# Patient Record
Sex: Female | Born: 1935 | Marital: Married | Smoking: Never smoker
Health system: Southern US, Community
[De-identification: ages and names within clinical notes are randomized; demographics above are authoritative.]

## PROBLEM LIST (undated history)

## (undated) DIAGNOSIS — I10 Essential (primary) hypertension: Secondary | ICD-10-CM

## (undated) HISTORY — PX: ORIF DISTAL RADIUS FRACTURE: SUR927

---

## 2000-01-19 ENCOUNTER — Encounter: Admission: RE | Admit: 2000-01-19 | Discharge: 2000-01-19 | Payer: Self-pay | Admitting: Geriatric Medicine

## 2000-01-19 ENCOUNTER — Encounter: Payer: Self-pay | Admitting: Geriatric Medicine

## 2001-01-20 ENCOUNTER — Encounter: Payer: Self-pay | Admitting: Geriatric Medicine

## 2001-01-20 ENCOUNTER — Encounter: Admission: RE | Admit: 2001-01-20 | Discharge: 2001-01-20 | Payer: Self-pay | Admitting: Geriatric Medicine

## 2002-01-15 ENCOUNTER — Ambulatory Visit (HOSPITAL_COMMUNITY): Admission: RE | Admit: 2002-01-15 | Discharge: 2002-01-15 | Payer: Self-pay | Admitting: Geriatric Medicine

## 2002-01-21 ENCOUNTER — Encounter: Admission: RE | Admit: 2002-01-21 | Discharge: 2002-01-21 | Payer: Self-pay | Admitting: Geriatric Medicine

## 2002-01-21 ENCOUNTER — Encounter: Payer: Self-pay | Admitting: Geriatric Medicine

## 2003-01-21 ENCOUNTER — Encounter: Admission: RE | Admit: 2003-01-21 | Discharge: 2003-01-21 | Payer: Self-pay

## 2003-01-21 ENCOUNTER — Encounter: Payer: Self-pay | Admitting: Geriatric Medicine

## 2004-02-11 ENCOUNTER — Encounter: Admission: RE | Admit: 2004-02-11 | Discharge: 2004-02-11 | Payer: Self-pay | Admitting: Geriatric Medicine

## 2004-03-06 ENCOUNTER — Ambulatory Visit (HOSPITAL_COMMUNITY): Admission: RE | Admit: 2004-03-06 | Discharge: 2004-03-06 | Payer: Self-pay | Admitting: Gastroenterology

## 2005-02-28 ENCOUNTER — Encounter: Admission: RE | Admit: 2005-02-28 | Discharge: 2005-02-28 | Payer: Self-pay | Admitting: Geriatric Medicine

## 2005-06-17 ENCOUNTER — Emergency Department (HOSPITAL_COMMUNITY): Admission: EM | Admit: 2005-06-17 | Discharge: 2005-06-17 | Payer: Self-pay | Admitting: Emergency Medicine

## 2005-06-20 ENCOUNTER — Emergency Department (HOSPITAL_COMMUNITY): Admission: EM | Admit: 2005-06-20 | Discharge: 2005-06-20 | Payer: Self-pay | Admitting: Family Medicine

## 2005-07-13 ENCOUNTER — Emergency Department (HOSPITAL_COMMUNITY): Admission: EM | Admit: 2005-07-13 | Discharge: 2005-07-13 | Payer: Self-pay | Admitting: Family Medicine

## 2005-07-15 ENCOUNTER — Emergency Department (HOSPITAL_COMMUNITY): Admission: EM | Admit: 2005-07-15 | Discharge: 2005-07-15 | Payer: Self-pay | Admitting: Family Medicine

## 2006-03-05 ENCOUNTER — Encounter: Admission: RE | Admit: 2006-03-05 | Discharge: 2006-03-05 | Payer: Self-pay | Admitting: Geriatric Medicine

## 2007-03-07 ENCOUNTER — Encounter: Admission: RE | Admit: 2007-03-07 | Discharge: 2007-03-07 | Payer: Self-pay | Admitting: Geriatric Medicine

## 2008-03-18 ENCOUNTER — Encounter: Admission: RE | Admit: 2008-03-18 | Discharge: 2008-03-18 | Payer: Self-pay | Admitting: Geriatric Medicine

## 2009-03-29 ENCOUNTER — Encounter: Admission: RE | Admit: 2009-03-29 | Discharge: 2009-03-29 | Payer: Self-pay | Admitting: Geriatric Medicine

## 2010-04-11 ENCOUNTER — Encounter: Admission: RE | Admit: 2010-04-11 | Discharge: 2010-04-11 | Payer: Self-pay | Admitting: Geriatric Medicine

## 2010-10-10 ENCOUNTER — Emergency Department (HOSPITAL_COMMUNITY): Admission: EM | Admit: 2010-10-10 | Discharge: 2010-10-10 | Payer: Self-pay | Admitting: Family Medicine

## 2010-10-13 ENCOUNTER — Ambulatory Visit (HOSPITAL_COMMUNITY): Admission: RE | Admit: 2010-10-13 | Discharge: 2010-10-13 | Payer: Self-pay | Admitting: Orthopedic Surgery

## 2011-01-09 NOTE — Procedures (Signed)
°

## 2011-01-09 NOTE — Miscellaneous (Signed)
°

## 2011-01-09 NOTE — Op Note (Signed)
°

## 2011-01-09 NOTE — Laboratory Report (Signed)
°

## 2011-01-09 NOTE — ED Provider Notes (Signed)
°

## 2011-01-09 NOTE — Patient Instructions (Signed)
°

## 2011-01-09 NOTE — Medication Information (Signed)
°

## 2011-01-09 NOTE — EKG Historical (Signed)
°

## 2011-01-09 NOTE — ED Notes (Signed)
°

## 2011-01-09 NOTE — Consult Note (Signed)
°

## 2011-01-09 NOTE — Consent Form (Signed)
°

## 2011-01-09 NOTE — Progress Notes (Signed)
°

## 2011-02-28 LAB — CBC
MCV: 84.8 fL (ref 78.0–100.0)
Platelets: 265 10*3/uL (ref 150–400)
RBC: 4.81 MIL/uL (ref 3.87–5.11)
WBC: 10.2 10*3/uL (ref 4.0–10.5)

## 2011-03-05 NOTE — Miscellaneous (Signed)
°

## 2011-03-15 ENCOUNTER — Other Ambulatory Visit: Payer: Self-pay | Admitting: Geriatric Medicine

## 2011-03-15 DIAGNOSIS — Z1231 Encounter for screening mammogram for malignant neoplasm of breast: Secondary | ICD-10-CM

## 2011-04-13 ENCOUNTER — Ambulatory Visit
Admission: RE | Admit: 2011-04-13 | Discharge: 2011-04-13 | Disposition: A | Discharge status: Home / Self Care | Payer: Medicare Other | Source: Ambulatory Visit | Attending: Geriatric Medicine | Admitting: Geriatric Medicine

## 2011-04-13 DIAGNOSIS — Z1231 Encounter for screening mammogram for malignant neoplasm of breast: Secondary | ICD-10-CM

## 2011-05-04 NOTE — Op Note (Signed)
NAME:  Anna, Gonzales NO.:  [id number]   MEDICAL RECORD NO.:  [id number]                   PATIENT TYPE:  AMB   LOCATION:  ENDO                                 FACILITY:  [facility]   PHYSICIAN:  Danise Edge, M.D.                DATE OF BIRTH:  Aug 19, 1926   DATE OF PROCEDURE:  03/06/2004  DATE OF DISCHARGE:                                 OPERATIVE REPORT   PROCEDURE:  Screening colonoscopy.   INDICATIONS:  Anna Gonzales is a 77 year old female born August 31, 1926.  Anna Gonzales is scheduled to undergo her first screening  colonoscopy with polypectomy to prevent colon cancer.  Her younger brother  was recently diagnosed with colon cancer.  Anna Gonzales has colonic  diverticulosis by flexible proctosigmoidoscopy performed by Dr. Merlene Laughter four years ago.   ENDOSCOPIST:  Danise Edge, M.D.   PREMEDICATION:  Versed 5 mg, Demerol 50 mg.   DESCRIPTION OF PROCEDURE:  After obtaining informed consent, Anna Gonzales  was placed in the left lateral decubitus position.  I administered  intravenous Demerol and intravenous Versed  to achieve conscious sedation  for the procedure.  The patient's blood pressure, oxygen saturation and  cardiac rhythm were monitored throughout the procedure and documented in the  medical record.   Anal inspection was normal.  Digital rectal exam was normal.  The Olympus  adjustable pediatric colonoscope was introduced into the rectum and advanced  to the cecum.  Colonic preparation for the exam today was excellent.   Rectum:  Normal.  Sigmoid colon and descending colon:  Left colonic diverticulosis.  Splenic flexure:  Normal.  Transverse colon:  Normal.  Hepatic flexure:  Normal.  Ascending colon:  Normal.  Cecum and ileocecal valve:  Normal.   ASSESSMENT:  Normal screening proctocolonoscopy to the cecum.  No endoscopic  evidence for the presence of colorectal  neoplasia.   RECOMMENDATIONS:  Repeat  colonoscopy in five years.                                               Danise Edge, M.D.    MJ/MEDQ  D:  03/06/2004  T:  03/06/2004  Job:  [id number]   cc:   Hal T. Stoneking, M.D.  [address] [place], [place] [address]  Fax: [phone]

## 2012-04-21 ENCOUNTER — Other Ambulatory Visit: Payer: Self-pay | Admitting: Geriatric Medicine

## 2012-04-21 DIAGNOSIS — Z1231 Encounter for screening mammogram for malignant neoplasm of breast: Secondary | ICD-10-CM

## 2012-04-25 ENCOUNTER — Ambulatory Visit
Admission: RE | Admit: 2012-04-25 | Discharge: 2012-04-25 | Disposition: A | Discharge status: Home / Self Care | Payer: Medicare Other | Source: Ambulatory Visit | Attending: Geriatric Medicine | Admitting: Geriatric Medicine

## 2012-04-25 DIAGNOSIS — Z1231 Encounter for screening mammogram for malignant neoplasm of breast: Secondary | ICD-10-CM

## 2012-07-01 ENCOUNTER — Encounter (HOSPITAL_COMMUNITY): Payer: Self-pay

## 2012-07-01 ENCOUNTER — Emergency Department (INDEPENDENT_AMBULATORY_CARE_PROVIDER_SITE_OTHER)
Admission: EM | Admit: 2012-07-01 | Discharge: 2012-07-01 | Disposition: A | Discharge status: Home / Self Care | Payer: Medicare Other | Source: Home / Self Care

## 2012-07-01 ENCOUNTER — Emergency Department (INDEPENDENT_AMBULATORY_CARE_PROVIDER_SITE_OTHER): Payer: Medicare Other

## 2012-07-01 DIAGNOSIS — M25559 Pain in unspecified hip: Secondary | ICD-10-CM

## 2012-07-01 DIAGNOSIS — M25552 Pain in left hip: Secondary | ICD-10-CM

## 2012-07-01 HISTORY — DX: Essential (primary) hypertension: I10

## 2012-07-01 MED ORDER — TRAMADOL HCL 50 MG PO TABS: 50.0000 mg | ORAL_TABLET | Freq: Four times a day (QID) | ORAL | Status: AC | PRN

## 2012-07-01 MED ORDER — ACETAMINOPHEN ER 650 MG PO TBCR: 650.0000 mg | EXTENDED_RELEASE_TABLET | Freq: Three times a day (TID) | ORAL | Status: DC | PRN

## 2012-07-01 NOTE — ED Notes (Signed)
C/o pain in low back and into hip

## 2012-07-01 NOTE — ED Provider Notes (Signed)
History     CSN: [id number]  Arrival date & time 07/01/12  1059   None     Chief Complaint  Patient presents with   Back Pain    (Consider location/radiation/quality/duration/timing/severity/associated sxs/prior treatment) Patient is a 78 y.o. female presenting with back pain. The history is provided by the patient.  Back Pain   complains of left hip pain described as intermittent in nature that began yesterday. The pain is aggravated with sitting and movement of left lower extremity.  No known injury noted.  There is no associated numbness in the left leg. Has taken aspirin because it is part of her daily regimen but no other medications for pain; no relief.  Denies urinary symptoms.  Pain is 3/10. No red flags such as fevers, h/o trauma with bony tenderness, neurological deficits, h/o CA, unexplained weight loss, pain worse at night, pain at rest,  h/o prolonged steroid use or h/o osteopenia.  Reports last Bone scan two years ago with reports of normal scan.  Denies hx of neurological or bone disorders, broke left wrist in fall two years ago.    Past Medical History  Diagnosis Date   Hypertension     Past Surgical History  Procedure Date   Orif distal radius fracture     History reviewed. No pertinent family history.  History  Substance Use Topics   Smoking status: Never Smoker    Smokeless tobacco: Not on file   Alcohol Use: No    OB History    Grav Para Term Preterm Abortions TAB SAB Ect Mult Living                  Review of Systems  Musculoskeletal: Positive for back pain.  All other systems reviewed and are negative.    Allergies  Review of patient's allergies indicates no known allergies.  Home Medications   Current Outpatient Rx  Name Route Sig Dispense Refill   HYDROCHLOROTHIAZIDE 25 MG PO TABS Oral Take 25 mg by mouth daily.     ACETAMINOPHEN ER 650 MG PO TBCR Oral Take 1 tablet (650 mg total) by mouth every 8 (eight) hours as needed for  pain. 60 tablet 2   TRAMADOL HCL 50 MG PO TABS Oral Take 1 tablet (50 mg total) by mouth every 6 (six) hours as needed for pain. 15 tablet 0    BP 158/76   Pulse 82   Temp 98 F (36.7 C) (Oral)   Resp 16   SpO2 95%  Physical Exam  Nursing note and vitals reviewed. Constitutional: She is oriented to person, place, and time. Vital signs are normal. She appears well-developed and well-nourished. She is active and cooperative.  HENT:  Head: Normocephalic.  Eyes: Conjunctivae are normal. Pupils are equal, round, and reactive to light. No scleral icterus.  Neck: Trachea normal and normal range of motion. Neck supple. No spinous process tenderness and no muscular tenderness present.  Cardiovascular: Normal rate, regular rhythm, normal heart sounds and normal pulses.   Pulmonary/Chest: Effort normal and breath sounds normal.  Musculoskeletal:       Right hip: Normal.       Left hip: She exhibits tenderness. She exhibits normal range of motion, normal strength, no bony tenderness, no swelling and no crepitus.       Right knee: Normal.       Left knee: Normal.       Right ankle: Normal.       Left ankle: Normal.  Painful ROM, internal and external rotation  Neurological: She is alert and oriented to person, place, and time. She has normal strength and normal reflexes. No cranial nerve deficit or sensory deficit. Gait normal. GCS eye subscore is 4. GCS verbal subscore is 5. GCS motor subscore is 6.  Skin: Skin is warm and dry.  Psychiatric: She has a normal mood and affect. Her speech is normal and behavior is normal. Judgment and thought content normal. Cognition and memory are normal.    ED Course  Procedures (including critical care time)  Labs Reviewed - No data to display Dg Hip Complete Left  07/01/2012  *RADIOLOGY REPORT*  Clinical Data: Left hip pain.  LEFT HIP - COMPLETE 2+ VIEW  Comparison: 07/15/2005  Findings: Hip joints are symmetric and unremarkable. No acute bony  abnormality.  Specifically, no fracture, subluxation, or dislocation.  Soft tissues are intact.  IMPRESSION: No acute bony abnormality.  Original Report Authenticated By: Cyndie Chime, M.D.     1. Left hip pain       MDM  No abnormalities on imaging.  Take medication as prescribed.  Follow up with your primary care provider or one of the orthopedic physicians you have seen in the past if this problem persist.         Johnsie Kindred, NP 07/01/12 1210

## 2012-07-01 NOTE — ED Provider Notes (Signed)
Medical screening examination/treatment/procedure(s) were performed by non-physician practitioner and as supervising physician I was immediately available for consultation/collaboration.  Leslee Home, M.D.   Reuben Likes, MD 07/01/12 [phone]

## 2012-12-08 ENCOUNTER — Encounter (HOSPITAL_COMMUNITY): Payer: Self-pay

## 2012-12-08 ENCOUNTER — Emergency Department (HOSPITAL_COMMUNITY)
Admission: EM | Admit: 2012-12-08 | Discharge: 2012-12-08 | Disposition: A | Discharge status: Home / Self Care | Payer: Medicare Other | Attending: Emergency Medicine | Admitting: Emergency Medicine

## 2012-12-08 ENCOUNTER — Emergency Department (HOSPITAL_COMMUNITY): Payer: Medicare Other

## 2012-12-08 DIAGNOSIS — Z79899 Other long term (current) drug therapy: Secondary | ICD-10-CM | POA: Insufficient documentation

## 2012-12-08 DIAGNOSIS — Y929 Unspecified place or not applicable: Secondary | ICD-10-CM | POA: Insufficient documentation

## 2012-12-08 DIAGNOSIS — Y939 Activity, unspecified: Secondary | ICD-10-CM | POA: Insufficient documentation

## 2012-12-08 DIAGNOSIS — S7001XA Contusion of right hip, initial encounter: Secondary | ICD-10-CM

## 2012-12-08 DIAGNOSIS — Z7982 Long term (current) use of aspirin: Secondary | ICD-10-CM | POA: Insufficient documentation

## 2012-12-08 DIAGNOSIS — S7000XA Contusion of unspecified hip, initial encounter: Secondary | ICD-10-CM | POA: Insufficient documentation

## 2012-12-08 DIAGNOSIS — W010XXA Fall on same level from slipping, tripping and stumbling without subsequent striking against object, initial encounter: Secondary | ICD-10-CM | POA: Insufficient documentation

## 2012-12-08 DIAGNOSIS — I10 Essential (primary) hypertension: Secondary | ICD-10-CM | POA: Insufficient documentation

## 2012-12-08 NOTE — ED Notes (Signed)
Pt energetic, family at bedside, A&Ox4, stable.

## 2012-12-08 NOTE — ED Notes (Signed)
Per EMS, pt slipped on a rug, landing on her (R) side/hip area, pt has an abrasion to RFA. Pt presents w/KED applied, pain 5/10, no deformities or rotation, bulging noted to (R) L4 region. Pt refused pain medication or IV insertion. Pt a/o x4, pt denies dizziness causing the fall. Pt reports her rug curled up causing her to slip and fall

## 2012-12-09 NOTE — ED Provider Notes (Signed)
History     CSN: [id number]  Arrival date & time 12/08/12  2015   First MD Initiated Contact with Patient 12/08/12 2105      Chief Complaint  Patient presents with   Fall    (Consider location/radiation/quality/duration/timing/severity/associated sxs/prior treatment) Patient is a 78 y.o. female presenting with fall. The history is provided by the patient. No language interpreter was used.  Fall The accident occurred 1 to 2 hours ago. Incident: She slipped on a rug and fell, landing on her buttocks.  She has pain in the right buttock. She fell from a height of 1 to 2 ft. She landed on carpet. There was no blood loss. Point of impact: Right buttock. Pain location: Right buttock. The pain is at a severity of 5/10. The pain is moderate. She was ambulatory at the scene. There was no entrapment after the fall. There was no drug use involved in the accident. There was no alcohol use involved in the accident. Associated symptoms comments: None . Treatment on scene includes a c-collar and a backboard. Treatments tried: To [facility] ED via EMS. The treatment provided no relief.    Past Medical History  Diagnosis Date   Hypertension     Past Surgical History  Procedure Date   Orif distal radius fracture     History reviewed. No pertinent family history.  History  Substance Use Topics   Smoking status: Never Smoker    Smokeless tobacco: Not on file   Alcohol Use: No    OB History    Grav Para Term Preterm Abortions TAB SAB Ect Mult Living                  Review of Systems  All other systems reviewed and are negative.    Allergies  Sulfa antibiotics and Penicillins  Home Medications   Current Outpatient Rx  Name  Route  Sig  Dispense  Refill   AMLODIPINE BESYLATE 5 MG PO TABS   Oral   Take 5 mg by mouth daily.          ASPIRIN 325 MG PO TABS   Oral   Take 325 mg by mouth daily.          BENAZEPRIL HCL 10 MG PO TABS   Oral   Take 10 mg by mouth  daily.          CALCIUM PO   Oral   Take 1 tablet by mouth daily.          VITAMIN D 1000 UNITS PO TABS   Oral   Take 2,000 Units by mouth daily.          HYDROCHLOROTHIAZIDE 25 MG PO TABS   Oral   Take 25 mg by mouth daily.          EYE VITAMINS PO CAPS   Oral   Take 1 capsule by mouth daily.          SYSTANE OP   Ophthalmic   Apply 1 drop to eye daily. For both eyes          VITAMIN E 400 UNITS PO CAPS   Oral   Take 400 Units by mouth daily.           BP 151/65   Pulse 95   Temp 98.2 F (36.8 C) (Oral)   Resp 18   SpO2 93%  Physical Exam  Nursing note and vitals reviewed. Constitutional: She is oriented to person, place, and time.  She appears well-developed and well-nourished. No distress.  HENT:  Head: Normocephalic and atraumatic.  Right Ear: External ear normal.  Left Ear: External ear normal.  Mouth/Throat: Oropharynx is clear and moist.  Eyes: Conjunctivae normal and EOM are normal. Pupils are equal, round, and reactive to light.  Neck: Normal range of motion. Neck supple.  Cardiovascular: Normal rate, regular rhythm and normal heart sounds.   Pulmonary/Chest: Effort normal and breath sounds normal. She exhibits no tenderness.  Abdominal: Soft. Bowel sounds are normal.  Musculoskeletal:       She localizes pain to the right buttock.  She has no palpable deformity.  ROM of the hips causes mild discomfort.  Neurological: She is alert and oriented to person, place, and time.       No sensory or motor deficit.  Skin: Skin is warm and dry.  Psychiatric: She has a normal mood and affect. Her behavior is normal.    ED Course  Procedures (including critical care time)  Labs Reviewed - No data to display Dg Hip Complete Right  12/08/2012  *RADIOLOGY REPORT*  Clinical Data: Fall  RIGHT HIP - COMPLETE 2+ VIEW  Comparison: None.  Findings: No fracture or dislocation is seen.  Mild symmetric joint space narrowing of the bilateral hips.   Visualized bony pelvis appears intact.  Degenerative changes of the lower lumbar spine.  IMPRESSION: No fracture or dislocation is seen.   Original Report Authenticated By: Charline Bills, M.D.    There was confusion with her x-rays.  She had an x-ray of the left hip that was negative.  She went for x-ray of the right hip which was negative.  Her family noted that the x-ray of the left hip had been done on a different date, which I confirmed with radiology.  I apologized to pt and family about the confusion of the x-ray result.  Where she had no fracture and did not want pain medicine, pt was released home.  1. Contusion of right hip         Carleene Cooper III, MD 12/09/12 1244

## 2012-12-11 NOTE — Miscellaneous (Signed)
°

## 2013-06-15 ENCOUNTER — Other Ambulatory Visit: Payer: Self-pay

## 2013-06-15 DIAGNOSIS — Z1231 Encounter for screening mammogram for malignant neoplasm of breast: Secondary | ICD-10-CM

## 2013-06-26 ENCOUNTER — Ambulatory Visit
Admission: RE | Admit: 2013-06-26 | Discharge: 2013-06-26 | Disposition: A | Discharge status: Home / Self Care | Payer: Medicare Other | Source: Ambulatory Visit

## 2013-06-26 DIAGNOSIS — Z1231 Encounter for screening mammogram for malignant neoplasm of breast: Secondary | ICD-10-CM

## 2015-02-10 ENCOUNTER — Encounter (HOSPITAL_COMMUNITY): Payer: Self-pay | Admitting: *Deleted

## 2015-02-10 ENCOUNTER — Emergency Department (HOSPITAL_COMMUNITY)
Admission: EM | Admit: 2015-02-10 | Discharge: 2015-02-10 | Disposition: A | Discharge status: Home / Self Care | Payer: Medicare Other | Attending: Emergency Medicine | Admitting: Emergency Medicine

## 2015-02-10 ENCOUNTER — Emergency Department (HOSPITAL_COMMUNITY): Payer: Medicare Other

## 2015-02-10 DIAGNOSIS — S20212A Contusion of left front wall of thorax, initial encounter: Secondary | ICD-10-CM

## 2015-02-10 DIAGNOSIS — I10 Essential (primary) hypertension: Secondary | ICD-10-CM | POA: Insufficient documentation

## 2015-02-10 DIAGNOSIS — Y998 Other external cause status: Secondary | ICD-10-CM | POA: Diagnosis not present

## 2015-02-10 DIAGNOSIS — Z880 Allergy status to penicillin: Secondary | ICD-10-CM | POA: Insufficient documentation

## 2015-02-10 DIAGNOSIS — Z79899 Other long term (current) drug therapy: Secondary | ICD-10-CM | POA: Diagnosis not present

## 2015-02-10 DIAGNOSIS — Y93E5 Activity, floor mopping and cleaning: Secondary | ICD-10-CM | POA: Diagnosis not present

## 2015-02-10 DIAGNOSIS — W01198A Fall on same level from slipping, tripping and stumbling with subsequent striking against other object, initial encounter: Secondary | ICD-10-CM | POA: Insufficient documentation

## 2015-02-10 DIAGNOSIS — Y92096 Garden or yard of other non-institutional residence as the place of occurrence of the external cause: Secondary | ICD-10-CM | POA: Insufficient documentation

## 2015-02-10 DIAGNOSIS — Z7982 Long term (current) use of aspirin: Secondary | ICD-10-CM | POA: Diagnosis not present

## 2015-02-10 DIAGNOSIS — Z91040 Latex allergy status: Secondary | ICD-10-CM | POA: Diagnosis not present

## 2015-02-10 DIAGNOSIS — S299XXA Unspecified injury of thorax, initial encounter: Secondary | ICD-10-CM | POA: Diagnosis present

## 2015-02-10 MED ORDER — TRAMADOL HCL 50 MG PO TABS: 50.0000 mg | ORAL_TABLET | Freq: Four times a day (QID) | ORAL | Status: DC | PRN

## 2015-02-10 MED ORDER — IBUPROFEN 600 MG PO TABS: 600.0000 mg | ORAL_TABLET | Freq: Three times a day (TID) | ORAL | Status: DC | PRN

## 2015-02-10 NOTE — ED Provider Notes (Signed)
CSN: [id number]     Arrival date & time 02/10/15  1826 History   First MD Initiated Contact with Patient 02/10/15 1900     Chief Complaint  Patient presents with   Flank Pain    left - r/t fall     (Consider location/radiation/quality/duration/timing/severity/associated sxs/prior Treatment) HPI   Patient is a very pleasant 81 year old female who presents today complaining of left sided back pain.  Around 3:30 PM this afternoon she was cleaning her yard, tripped on a limb, and subsequently hit her back left torso on a pillar.  Pain with deep inspiration and movement. She denies hitting her head or any LOC. Denies SOB, CP, HA, or abdominal pain. She took 325 mg ASA; however, this did not alleviate her pain.  Per EMS patient received 50 mcg of Fentanyl en route.  Past Medical History  Diagnosis Date   Hypertension    Past Surgical History  Procedure Laterality Date   Orif distal radius fracture     No family history on file. History  Substance Use Topics   Smoking status: Never Smoker    Smokeless tobacco: Not on file   Alcohol Use: No   OB History    No data available     Review of Systems Negative unless otherwise stated in HPI.    Allergies  Sulfa antibiotics; Latex; and Penicillins  Home Medications   Prior to Admission medications   Medication Sig Start Date End Date Taking? Authorizing Provider  amLODipine (NORVASC) 10 MG tablet Take 10 mg by mouth daily.   Yes Historical Provider, MD  aspirin 325 MG tablet Take 325 mg by mouth daily.   Yes Historical Provider, MD  benazepril (LOTENSIN) 10 MG tablet Take 10 mg by mouth daily.   Yes Historical Provider, MD  CALCIUM PO Take 1 tablet by mouth daily.   Yes Historical Provider, MD  cholecalciferol (VITAMIN D) 1000 UNITS tablet Take 2,000 Units by mouth daily.   Yes Historical Provider, MD  Cyanocobalamin (VITAMIN B 12 PO) Take 1 tablet by mouth daily.   Yes Historical Provider, MD  hydrochlorothiazide  (HYDRODIURIL) 25 MG tablet Take 25 mg by mouth daily.   Yes Historical Provider, MD  Multiple Vitamins-Minerals (EYE VITAMINS) CAPS Take 1 capsule by mouth daily.   Yes Historical Provider, MD  Polyethyl Glycol-Propyl Glycol (SYSTANE OP) Apply 1 drop to eye daily. For both eyes   Yes Historical Provider, MD  vitamin E 400 UNIT capsule Take 400 Units by mouth daily.   Yes Historical Provider, MD   BP 149/49 mmHg   Pulse 72   Temp(Src) 97.6 F (36.4 C) (Oral)   Resp 18   SpO2 93% Physical Exam  Constitutional: She is oriented to person, place, and time. She appears well-developed and well-nourished. No distress.  HENT:  Head: Normocephalic and atraumatic.  Eyes: Conjunctivae and EOM are normal. Pupils are equal, round, and reactive to light.  Neck: Normal range of motion. Neck supple.  Cardiovascular: Normal rate, regular rhythm and normal heart sounds.   No murmur heard. Pulmonary/Chest: Effort normal and breath sounds normal. No accessory muscle usage. No respiratory distress.    Abdominal: Soft. Bowel sounds are normal.  Musculoskeletal:       Thoracic back: Normal. She exhibits no tenderness and no bony tenderness.       Back:  Neurological: She is alert and oriented to person, place, and time.  Skin: Skin is warm, dry and intact. No abrasion, no bruising, no  ecchymosis, no laceration and no lesion noted. No erythema.  Psychiatric: She has a normal mood and affect. Her behavior is normal. Thought content normal.    ED Course  Procedures (including critical care time)  Imaging Review No results found.   EKG Interpretation None      MDM   Final diagnoses:  None    81 year old female complaining of left sided back pain after hitting a pillar.  Patient be treated for a nondisplaced rib fracture versus contusion of the rib.  Patient has been stable here in the emergency department.  She is advised follow-up with her primary care Dr. told to return here as  needed    Carlyle Dolly, PA-C 02/13/15 1404  Rolland Porter, MD 02/28/15 0700

## 2015-02-10 NOTE — ED Notes (Signed)
[facility] Expected date:  Expected time:  Means of arrival:  Comments: EMS-fall

## 2015-02-10 NOTE — ED Notes (Signed)
Per EMS - patient comes from home where she lives with her husband.  Patient was cleaning up brush and limbs in her yards and tripped over it.  Patient stumbled and fell into a pillar on her porch @ 1530 today.  Patient did not fall to ground and did not hit her head.  Patient denies LOC.  Patient c/o left rib pain.  Patient took a 325 mg aspirin for the pain without relief.  Patient states movement makes pain worse. No obvious deformities or bruising noted.  Patient states it hurts to take a deep breath. Patient's vitals, 97% RA, 154/70, HR 86, RR 16.  Patient received 50 mcg of Fentanyl en route with EMS.

## 2015-02-10 NOTE — Discharge Instructions (Signed)
Return here as needed. Follow up with your doctor. °

## 2015-02-10 NOTE — ED Notes (Signed)
Patient was cleaning up her yard this afternoon when she tripped over a limb and fell, hitting left side/flank on a porch pillar.  Patient c/o left flank pain.  Pain increases with palpation and movement.  Patient hitting head and LOC.  On exam, patient's lung sounds are clear in all lobes, including basis and lower lobes.  Heart sounds WNL, S1/S2, no gallop or rub.  Patient has +2 radial and pedal pulses.  Pain increases with palpation to left flank/ris.  Patient denies SOB, but states pain increases with deep breathing.

## 2015-08-09 ENCOUNTER — Emergency Department (HOSPITAL_COMMUNITY): Payer: Medicare Other

## 2015-08-09 ENCOUNTER — Observation Stay (HOSPITAL_COMMUNITY)
Admission: EM | Admit: 2015-08-09 | Discharge: 2015-08-10 | Disposition: A | Discharge status: Home / Self Care | Payer: Medicare Other | Attending: Internal Medicine | Admitting: Internal Medicine

## 2015-08-09 ENCOUNTER — Encounter (HOSPITAL_COMMUNITY): Payer: Self-pay | Admitting: Emergency Medicine

## 2015-08-09 DIAGNOSIS — Z880 Allergy status to penicillin: Secondary | ICD-10-CM | POA: Diagnosis not present

## 2015-08-09 DIAGNOSIS — E860 Dehydration: Secondary | ICD-10-CM | POA: Diagnosis present

## 2015-08-09 DIAGNOSIS — R079 Chest pain, unspecified: Secondary | ICD-10-CM

## 2015-08-09 DIAGNOSIS — N179 Acute kidney failure, unspecified: Secondary | ICD-10-CM | POA: Diagnosis present

## 2015-08-09 DIAGNOSIS — I10 Essential (primary) hypertension: Secondary | ICD-10-CM | POA: Diagnosis present

## 2015-08-09 DIAGNOSIS — Z79899 Other long term (current) drug therapy: Secondary | ICD-10-CM | POA: Diagnosis not present

## 2015-08-09 DIAGNOSIS — A084 Viral intestinal infection, unspecified: Secondary | ICD-10-CM

## 2015-08-09 DIAGNOSIS — Z7982 Long term (current) use of aspirin: Secondary | ICD-10-CM | POA: Insufficient documentation

## 2015-08-09 DIAGNOSIS — Z91040 Latex allergy status: Secondary | ICD-10-CM | POA: Insufficient documentation

## 2015-08-09 LAB — BASIC METABOLIC PANEL
Anion gap: 9 (ref 5–15)
BUN: 19 mg/dL (ref 6–20)
CHLORIDE: 100 mmol/L — AB (ref 101–111)
CO2: 26 mmol/L (ref 22–32)
CREATININE: 1.33 mg/dL — AB (ref 0.44–1.00)
Calcium: 8.6 mg/dL — ABNORMAL LOW (ref 8.9–10.3)
GFR calc Af Amer: 41 mL/min — ABNORMAL LOW (ref >60)
GFR calc non Af Amer: 35 mL/min — ABNORMAL LOW (ref >60)
Glucose, Bld: 168 mg/dL — ABNORMAL HIGH (ref 65–99)
Potassium: 3.1 mmol/L — ABNORMAL LOW (ref 3.5–5.1)
SODIUM: 135 mmol/L (ref 135–145)

## 2015-08-09 LAB — CBC
HCT: 36 % (ref 36.0–46.0)
HCT: 36.8 % (ref 36.0–46.0)
HEMOGLOBIN: 12.4 g/dL (ref 12.0–15.0)
Hemoglobin: 12.2 g/dL (ref 12.0–15.0)
MCH: 27.6 pg (ref 26.0–34.0)
MCH: 27.6 pg (ref 26.0–34.0)
MCHC: 33.9 g/dL (ref 30.0–36.0)
MCHC: 33.7 g/dL (ref 30.0–36.0)
MCV: 81.4 fL (ref 78.0–100.0)
MCV: 82 fL (ref 78.0–100.0)
PLATELETS: 216 10*3/uL (ref 150–400)
Platelets: 231 10*3/uL (ref 150–400)
RBC: 4.42 MIL/uL (ref 3.87–5.11)
RBC: 4.49 MIL/uL (ref 3.87–5.11)
RDW: 13 % (ref 11.5–15.5)
RDW: 13.1 % (ref 11.5–15.5)
WBC: 6.3 10*3/uL (ref 4.0–10.5)
WBC: 5.2 10*3/uL (ref 4.0–10.5)

## 2015-08-09 LAB — I-STAT TROPONIN, ED: Troponin i, poc: 0 ng/mL (ref 0.00–0.08)

## 2015-08-09 LAB — TROPONIN I: Troponin I: <0.03 ng/mL (ref <0.031)

## 2015-08-09 LAB — CREATININE, SERUM
CREATININE: 1.51 mg/dL — AB (ref 0.44–1.00)
GFR, EST AFRICAN AMERICAN: 35 mL/min — AB (ref >60)
GFR, EST NON AFRICAN AMERICAN: 30 mL/min — AB (ref >60)

## 2015-08-09 MED ORDER — ACETAMINOPHEN 325 MG PO TABS: 650.0000 mg | ORAL_TABLET | ORAL | Status: DC | PRN

## 2015-08-09 MED ORDER — BENAZEPRIL HCL 10 MG PO TABS
10.0000 mg | ORAL_TABLET | Freq: Every day | ORAL | Status: DC
  Filled 2015-08-09: qty 1

## 2015-08-09 MED ORDER — ENOXAPARIN SODIUM 30 MG/0.3ML ~~LOC~~ SOLN
30.0000 mg | SUBCUTANEOUS | Status: DC
  Filled 2015-08-09: qty 0.3

## 2015-08-09 MED ORDER — POLYVINYL ALCOHOL 1.4 % OP SOLN
1.0000 [drp] | Freq: Every day | OPHTHALMIC | Status: DC
  Administered 2015-08-10: 1 [drp] via OPHTHALMIC
  Filled 2015-08-09: qty 15

## 2015-08-09 MED ORDER — OCUVITE-LUTEIN PO CAPS
1.0000 | ORAL_CAPSULE | Freq: Every day | ORAL | Status: DC
  Filled 2015-08-09 (×2): qty 1

## 2015-08-09 MED ORDER — ONDANSETRON HCL 4 MG/2ML IJ SOLN: 4.0000 mg | Freq: Four times a day (QID) | INTRAMUSCULAR | Status: DC | PRN

## 2015-08-09 MED ORDER — SODIUM CHLORIDE 0.9 % IV BOLUS (SEPSIS)
500.0000 mL | Freq: Once | INTRAVENOUS | Status: AC
  Administered 2015-08-09: 500 mL via INTRAVENOUS

## 2015-08-09 MED ORDER — POTASSIUM CHLORIDE CRYS ER 20 MEQ PO TBCR
40.0000 meq | EXTENDED_RELEASE_TABLET | Freq: Four times a day (QID) | ORAL | Status: AC
  Administered 2015-08-09 – 2015-08-10 (×2): 40 meq via ORAL
  Filled 2015-08-09 (×2): qty 2

## 2015-08-09 MED ORDER — SODIUM CHLORIDE 0.9 % IV SOLN
INTRAVENOUS | Status: DC
  Administered 2015-08-09: 500 mL via INTRAVENOUS
  Administered 2015-08-10: 02:00:00 via INTRAVENOUS

## 2015-08-09 MED ORDER — ASPIRIN 325 MG PO TABS
325.0000 mg | ORAL_TABLET | Freq: Every day | ORAL | Status: DC
  Administered 2015-08-10: 325 mg via ORAL
  Filled 2015-08-09: qty 1

## 2015-08-09 MED ORDER — AMLODIPINE BESYLATE 10 MG PO TABS
10.0000 mg | ORAL_TABLET | Freq: Every day | ORAL | Status: DC
  Administered 2015-08-10: 10 mg via ORAL
  Filled 2015-08-09: qty 1

## 2015-08-09 NOTE — ED Notes (Signed)
Per EMS, pt was cleaning today from 6-11 at her home when she began experiencing SOB and CP. Pt took 325 of aspirin prior to EMS arrival. Pts SpO2 was 96% on RA EMS gave her 2 L which eased her CP and SOB. In route EMS gave  of albuterol, .5 of Atrovent and 125 of solu-medrol. Pt denies all CP at this time. Pt denies SOB. PT alert x4. NAD at this time. PT speaching in full sentences.

## 2015-08-09 NOTE — Consult Note (Signed)
Patient ID: Anna Gonzales MRN: [id number], DOB/AGE: 1926-02-15   Admit date: 08/09/2015   Primary Physician: Ginette Otto, MD Primary Cardiologist: Dr Elease Hashimoto (new)  Pt. Profile:  Anna Gonzales is a 81 y.o. female with a history of HTN and no prior cardiac disease who presented to [facility] ED today with chest pressure and SOB while cleaning.   [name] is a remarkably healthy 81 year old female. She only takes medication for high blood pressure and has no past cardiac history. She was in her usual state of health until last Friday she developed diarrhea and nausea. This lasted until Saturday but she reported feeling weak since then. SHe has not had much to eat or drink. She's been very stressed lately due to having to care for her husband who has Alzheimer's disease. He sleeps all day and is awake all night making sleep difficult for her. This morning she decided to do some vigorous cleaning around the house and developed substernal chest pressure and associated shortness of breath. The chest pressure did not radiate and was not associated with diaphoresis, nausea or palpitations. She sat down and rested and the symptoms resolved. She has never had anything like this before.    Problem List  Past Medical History  Diagnosis Date   Hypertension     Past Surgical History  Procedure Laterality Date   Orif distal radius fracture       Allergies  Allergies  Allergen Reactions   Sulfa Antibiotics Nausea And Vomiting   Latex Hives and Rash   Penicillins Itching and Rash     Home Medications  Prior to Admission medications   Medication Sig Start Date End Date Taking? Authorizing Provider  amLODipine (NORVASC) 10 MG tablet Take 10 mg by mouth daily.    Historical Provider, MD  aspirin 325 MG tablet Take 325 mg by mouth daily.    Historical Provider, MD  benazepril (LOTENSIN) 10 MG tablet Take 10 mg by mouth daily.    Historical Provider, MD  CALCIUM PO Take  1 tablet by mouth daily.    Historical Provider, MD  cholecalciferol (VITAMIN D) 1000 UNITS tablet Take 2,000 Units by mouth daily.    Historical Provider, MD  Cyanocobalamin (VITAMIN B 12 PO) Take 1 tablet by mouth daily.    Historical Provider, MD  hydrochlorothiazide (HYDRODIURIL) 25 MG tablet Take 25 mg by mouth daily.    Historical Provider, MD  ibuprofen (ADVIL,MOTRIN) 600 MG tablet Take 1 tablet (600 mg total) by mouth every 8 (eight) hours as needed. 02/10/15   Charlestine Night, PA-C  Multiple Vitamins-Minerals (EYE VITAMINS) CAPS Take 1 capsule by mouth daily.    Historical Provider, MD  Polyethyl Glycol-Propyl Glycol (SYSTANE OP) Apply 1 drop to eye daily. For both eyes    Historical Provider, MD  traMADol (ULTRAM) 50 MG tablet Take 1 tablet (50 mg total) by mouth every 6 (six) hours as needed. 02/10/15   Charlestine Night, PA-C  vitamin E 400 UNIT capsule Take 400 Units by mouth daily.    Historical Provider, MD    Family History  No family history on file. No family status information on file.     Social History  Social History   Social History   Marital Status: Married    Spouse Name: N/A   Number of Children: N/A   Years of Education: N/A   Occupational History   Not on file.   Social History Main Topics   Smoking status: Never  Smoker    Smokeless tobacco: Not on file   Alcohol Use: No   Drug Use: No   Sexual Activity: Not on file   Other Topics Concern   Not on file   Social History Narrative     All other systems reviewed and are otherwise negative except as noted above.  Physical Exam  Blood pressure 135/47, pulse 79, temperature 98.9 F (37.2 C), temperature source Oral, resp. rate 20, SpO2 93 %.  General: Pleasant, NAD Psych: Normal affect. Neuro: Alert and oriented X 3. Moves all extremities spontaneously. HEENT: Normal  Neck: Supple without bruits or JVD. Lungs:  Resp regular and unlabored, CTA. Heart: RRR no s3, s4, or  murmurs. Abdomen: Soft, non-tender, non-distended, BS + x 4.  Extremities: No clubbing, cyanosis or edema. DP/PT/Radials 2+ and equal bilaterally.  Labs  No results for input(s): CKTOTAL, CKMB, TROPONINI in the last 72 hours. Lab Results  Component Value Date   WBC 6.3 08/09/2015   HGB 12.2 08/09/2015   HCT 36.0 08/09/2015   MCV 81.4 08/09/2015   PLT 216 08/09/2015    Recent Labs Lab 08/09/15 1500  NA 135  K 3.1*  CL 100*  CO2 26  BUN 19  CREATININE 1.33*  CALCIUM 8.6*  GLUCOSE 168*     Radiology/Studies  Dg Chest 2 View  08/09/2015   CLINICAL DATA:  Acute shortness of breath and wheezing today.  EXAM: CHEST  2 VIEW  COMPARISON:  02/10/2015 and 10/13/2010 chest radiographs  FINDINGS: Cardiomegaly, hyperinflation, mild peribronchial thickening and basilar scarring again noted.  There is no evidence of focal airspace disease, pulmonary edema, suspicious pulmonary nodule/mass, pleural effusion, or pneumothorax. No acute bony abnormalities are identified.  Remote left-sided rib fractures are identified.  IMPRESSION: Cardiomegaly without evidence of acute cardiopulmonary disease.   Electronically Signed   By: Harmon Pier M.D.   On: 08/09/2015 15:36    ECG  NSR HR  86. Some mild ST flattening in v4-v6.  ASSESSMENT AND PLAN  [name] is a 81 y.o. female with a history of HTN and no prior cardiac disease who presented to [facility] ED today with chest pressure and SOB while cleaning.   Chest pain- troponin neg x1. ECG with no acute ST or TW changes. Continue to cycle enzymes -- Low suspicion for cardiac disease in this healthy 81 year old. She was very weak and tired from a recent GI illness and had not eaten or had much to drink. Recommend observation overnight for IVFs and potassium repletion. If she rules out she can see Dr Elease Hashimoto as an outpatient if she continues to have chest discomfort.   HTN- Continue HCTZ, amlodipine and benazepril.   Hypokalemia- supplement. She needs  a supplement with her HCTZ at discharge  Dehydration- from poor PO intake due to recent GI illness. Treat with IVFs   Signed, Janetta Hora, PA-C 08/09/2015, 4:08 PM  Pager [phone]  Attending Note:   The patient was seen and examined.  Agree with assessment and plan as noted above.  Changes made to the above note as needed.  Ms. Mellor is an 81 yo with hx of HTN but no other previous cardiac issues who  presents with 3-4 days of diarrhea, nausea, vomitting.  She has continued to take her BP meds as directed - despite having very poor PO intake and having diarrhea . Today she developed mild chest pressure after doing some household chores   Her ECG showed some very minor  ST depression   She is now very comfortable. On exam, she is volume depleted. She has decreased skin turgur Lungs are clear Cor :  RR  Creatinine and BUN are a bit elevated from what I would expect - although we do not have any old labs available.  I thinks she needs to be gently hydrated, would hold HCTZ for 1-2 days . Collect troponins. I would not necessarily order a myoview. We can do that as an OP if she has any additional problems.    We will sign off.  Call for further issues .   I would be happy to see her in the office for follow up .  Vesta Mixer, Montez Hageman., MD, [facility] 08/09/2015, 5:33 PM [address],  [address] Office [phone] Pager [phone]

## 2015-08-09 NOTE — ED Notes (Signed)
Patient resting on stretcher; visitor at bedside; no needs at this time

## 2015-08-09 NOTE — H&P (Signed)
[facility] History and Physical  Anna Gonzales ZOX:[id number] DOB: 1926-07-29 DOA: 08/09/2015  Referring physician:  PCP: Ginette Otto, MD   Chief Complaint: Chest pain  HPI: Anna Gonzales is a 81 y.o. female who presents from the community is highly functional and independent with instrumental activities of daily living, having a past medical history of hypertension who presents to the emergency department with complaints of chest pain. She reports developing a GI illness last Friday having nausea/vomiting associated with diarrhea over the weekend. She reports symptoms improving by Monday. This morning she thought she felt better and got up to do some housework. She developed chest pain while cleaning her windows that was associate with shortness of breath. She characterizes her chest pain as pressure located in the retrosternal region that did not radiate. Initial lab work showed troponin within normal limits. EKG did not show acute ischemic changes. She was seen and evaluated by cardiology in the emergency department.                                       Review of Systems:  Constitutional:  No weight loss, night sweats, Fevers, chills, fatigue.  HEENT:  No headaches, Difficulty swallowing,Tooth/dental problems,Sore throat,  No sneezing, itching, ear ache, nasal congestion, post nasal drip,  Cardio-vascular:  Positive for chest pain, Orthopnea, PND, swelling in lower extremities, anasarca, dizziness, palpitations  GI:  No heartburn, indigestion, abdominal pain, nausea, vomiting, diarrhea, change in bowel habits, loss of appetite  Resp:  Positive for shortness of breath with exertion or at rest. No excess mucus, no productive cough, No non-productive cough, No coughing up of blood.No change in color of mucus.No wheezing.No chest wall deformity  Skin:  no rash or lesions.  GU:  no dysuria, change in color of urine, no urgency or frequency. No flank pain.   Musculoskeletal:  No joint pain or swelling. No decreased range of motion. No back pain.  Psych:  No change in mood or affect. No depression or anxiety. No memory loss.   Past Medical History  Diagnosis Date   Hypertension    Past Surgical History  Procedure Laterality Date   Orif distal radius fracture     Social History:  reports that she has never smoked. She does not have any smokeless tobacco history on file. She reports that she does not drink alcohol or use illicit drugs.  Allergies  Allergen Reactions   Sulfa Antibiotics Nausea And Vomiting   Latex Hives and Rash   Penicillins Itching and Rash    No family history on file.   Prior to Admission medications   Medication Sig Start Date End Date Taking? Authorizing Provider  amLODipine (NORVASC) 10 MG tablet Take 10 mg by mouth daily.   Yes Historical Provider, MD  aspirin 325 MG tablet Take 325 mg by mouth daily.   Yes Historical Provider, MD  benazepril (LOTENSIN) 10 MG tablet Take 10 mg by mouth daily.   Yes Historical Provider, MD  CALCIUM PO Take 1 tablet by mouth daily.   Yes Historical Provider, MD  cholecalciferol (VITAMIN D) 1000 UNITS tablet Take 2,000 Units by mouth daily.   Yes Historical Provider, MD  Cyanocobalamin (VITAMIN B 12 PO) Take 1 tablet by mouth daily.   Yes Historical Provider, MD  hydrochlorothiazide (HYDRODIURIL) 25 MG tablet Take 25 mg by mouth daily.   Yes Historical Provider, MD  Multiple  Vitamins-Minerals (EYE VITAMINS) CAPS Take 1 capsule by mouth daily.   Yes Historical Provider, MD  Polyethyl Glycol-Propyl Glycol (SYSTANE OP) Apply 1 drop to eye daily. For both eyes   Yes Historical Provider, MD  vitamin E 400 UNIT capsule Take 400 Units by mouth daily.   Yes Historical Provider, MD   Physical Exam: Filed Vitals:   08/09/15 1430 08/09/15 1445 08/09/15 1452 08/09/15 1600  BP: 133/52 124/48 135/47 127/46  Pulse: 82 84 79 74  Temp:      TempSrc:      Resp: SpO2: 96%  93% 93% 93%    Wt Readings from Last 3 Encounters:  No data found for Wt    General:  Appears calm and comfortable, in no acute distress, denies chest pain or shortness of breath Eyes: PERRL, normal lids, irises & conjunctiva ENT: grossly normal hearing, lips & tongue Neck: no LAD, masses or thyromegaly Cardiovascular: RRR, no m/r/g. No LE edema. Telemetry: SR, no arrhythmias  Respiratory: CTA bilaterally, no w/r/r. Normal respiratory effort. Abdomen: soft, ntnd Skin: no rash or induration seen on limited exam Musculoskeletal: grossly normal tone BUE/BLE Psychiatric: grossly normal mood and affect, speech fluent and appropriate Neurologic: grossly non-focal.          Labs on Admission:  Basic Metabolic Panel:  Recent Labs Lab 08/09/15 1500  NA 135  K 3.1*  CL 100*  CO2 26  GLUCOSE 168*  BUN 19  CREATININE 1.33*  CALCIUM 8.6*   Liver Function Tests: No results for input(s): AST, ALT, ALKPHOS, BILITOT, PROT, ALBUMIN in the last 168 hours. No results for input(s): LIPASE, AMYLASE in the last 168 hours. No results for input(s): AMMONIA in the last 168 hours. CBC:  Recent Labs Lab 08/09/15 1500  WBC 6.3  HGB 12.2  HCT 36.0  MCV 81.4  PLT 216   Cardiac Enzymes: No results for input(s): CKTOTAL, CKMB, CKMBINDEX, TROPONINI in the last 168 hours.  BNP (last 3 results) No results for input(s): BNP in the last 8760 hours.  ProBNP (last 3 results) No results for input(s): PROBNP in the last 8760 hours.  CBG: No results for input(s): GLUCAP in the last 168 hours.  Radiological Exams on Admission: Dg Chest 2 View  08/09/2015   CLINICAL DATA:  Acute shortness of breath and wheezing today.  EXAM: CHEST  2 VIEW  COMPARISON:  02/10/2015 and 10/13/2010 chest radiographs  FINDINGS: Cardiomegaly, hyperinflation, mild peribronchial thickening and basilar scarring again noted.  There is no evidence of focal airspace disease, pulmonary edema, suspicious pulmonary  nodule/mass, pleural effusion, or pneumothorax. No acute bony abnormalities are identified.  Remote left-sided rib fractures are identified.  IMPRESSION: Cardiomegaly without evidence of acute cardiopulmonary disease.   Electronically Signed   By: Harmon Pier M.D.   On: 08/09/2015 15:36    EKG: Independently reviewed. No acute ischemic changes noted.  Assessment/Plan Principal Problem:   Chest pain Active Problems:   Dehydration   Acute kidney injury   Hypertension   1. Chest pain. Patient presenting with chest pain having typical and atypical features. She reports recently having a GI illness addressed by mouth with episodes of nausea vomiting and diarrhea. She got up this morning to do some housework developing chest pain associated with shortness of breath. Initial labs were unremarkable, EKG did not show acute ischemic changes. Case was discussed with cardiology. Plan to cycle troponins overnight and if they remain negative would not pursue further cardiac workup  in the inpatient service. She will follow-up with Dr. Elease Hashimoto of cardiology in the outpatient clinic. 2. Acute kidney injury. Like he secondary to prerenal azotemia in setting of nausea vomiting and diarrhea over the weekend. She presents with a creatinine of 1.33. Will provide gentle IV fluid hydration, bolus with 500 ML's of normal saline followed by maintenance fluids running at 75 ML's per hour. He labs in a.m. 3. Probable infectious gastritis/viral syndrome. Patient reporting of the week and having multiple episodes of nausea vomiting associated with diarrhea likely making her dehydrated along with hydrochlorothiazide that she was taking at home. Will monitor overnight, provide IV fluids, repeat BMP in a.m. 4. Hypokalemia. Labs showed potassium of 3.1 likely secondary to GI losses from viral illness. All provide potassium replacement with K Dur 40 mEq by mouth 2 5. Hypertension. Blood pressures are stable in the emergency room  having systolic blood pressures in the 120s 130s, will continue with benazepril 10 mg by mouth daily and Norvasc 10 mg by mouth daily. Will discontinue hydrochlorothiazide therapy due to dehydration and a large right abnormalities. 6. DVT prophylaxis. Lovenox    Code Status: I discussed CODE STATUS with patient she is a DO NOT RESUSCITATE Family Communication: I spoke with family members were present at bedside Disposition Plan: Will place patient in overnight observation, do not insist be her requiring greater than 2 nights hospitalization  Time spent: 55 minutes  Jeralyn Bennett [facility] Pager [phone]

## 2015-08-09 NOTE — ED Notes (Signed)
Patient transported to X-ray °

## 2015-08-09 NOTE — ED Provider Notes (Signed)
CSN: [id number]     Arrival date & time 08/09/15  1327 History   First MD Initiated Contact with Patient 08/09/15 1354     Chief Complaint  Patient presents with   Shortness of Breath   Chest Pain     (Consider location/radiation/quality/duration/timing/severity/associated sxs/prior Treatment) HPI Patient was at her home cleaning windows when she developed a heavy pressure in her chest with associated shortness of breath. She had to stop what she was doing and she called EMS. She took 325 mg aspirin and instructed in once EMS arrived they applied oxygen. After oxygen and aspirin, the patient's chest pain eased and at this time she is denying active chest pain. She denies a recent cough, fevers or chills. She reports she has had some recent diarrhea and mild vomiting illness. He denies any abdominal pain. She denies any prior history of a heart attack. Past Medical History  Diagnosis Date   Hypertension    Past Surgical History  Procedure Laterality Date   Orif distal radius fracture     No family history on file. Social History  Substance Use Topics   Smoking status: Never Smoker    Smokeless tobacco: None   Alcohol Use: No   OB History    No data available     Review of Systems  10 Systems reviewed and are negative for acute change except as noted in the HPI.   Allergies  Sulfa antibiotics; Latex; and Penicillins  Home Medications   Prior to Admission medications   Medication Sig Start Date End Date Taking? Authorizing Provider  amLODipine (NORVASC) 10 MG tablet Take 10 mg by mouth daily.    Historical Provider, MD  aspirin 325 MG tablet Take 325 mg by mouth daily.    Historical Provider, MD  benazepril (LOTENSIN) 10 MG tablet Take 10 mg by mouth daily.    Historical Provider, MD  CALCIUM PO Take 1 tablet by mouth daily.    Historical Provider, MD  cholecalciferol (VITAMIN D) 1000 UNITS tablet Take 2,000 Units by mouth daily.    Historical Provider, MD   Cyanocobalamin (VITAMIN B 12 PO) Take 1 tablet by mouth daily.    Historical Provider, MD  hydrochlorothiazide (HYDRODIURIL) 25 MG tablet Take 25 mg by mouth daily.    Historical Provider, MD  ibuprofen (ADVIL,MOTRIN) 600 MG tablet Take 1 tablet (600 mg total) by mouth every 8 (eight) hours as needed. 02/10/15   Charlestine Night, PA-C  Multiple Vitamins-Minerals (EYE VITAMINS) CAPS Take 1 capsule by mouth daily.    Historical Provider, MD  Polyethyl Glycol-Propyl Glycol (SYSTANE OP) Apply 1 drop to eye daily. For both eyes    Historical Provider, MD  traMADol (ULTRAM) 50 MG tablet Take 1 tablet (50 mg total) by mouth every 6 (six) hours as needed. 02/10/15   Charlestine Night, PA-C  vitamin E 400 UNIT capsule Take 400 Units by mouth daily.    Historical Provider, MD   BP 135/47 mmHg   Pulse 79   Temp(Src) 98.9 F (37.2 C) (Oral)   Resp 20   SpO2 93% Physical Exam  Constitutional: She is oriented to person, place, and time. She appears well-developed and well-nourished.  HENT:  Head: Normocephalic and atraumatic.  Eyes: EOM are normal. Pupils are equal, round, and reactive to light.  Neck: Neck supple.  Cardiovascular: Normal rate, regular rhythm, normal heart sounds and intact distal pulses.   Pulmonary/Chest: Effort normal and breath sounds normal.  Abdominal: Soft. Bowel sounds are normal. She  exhibits no distension. There is no tenderness.  Musculoskeletal: Normal range of motion. She exhibits no edema.  Neurological: She is alert and oriented to person, place, and time. She has normal strength. Coordination normal. GCS eye subscore is 4. GCS verbal subscore is 5. GCS motor subscore is 6.  Skin: Skin is warm, dry and intact.  Psychiatric: She has a normal mood and affect.    ED Course  Procedures (including critical care time) Labs Review Labs Reviewed  BASIC METABOLIC PANEL - Abnormal; Notable for the following:    Potassium 3.1 (*)    Chloride 100 (*)    Glucose, Bld 168 (*)     Creatinine, Ser 1.33 (*)    Calcium 8.6 (*)    GFR calc non Af Amer 35 (*)    GFR calc Af Amer 41 (*)    All other components within normal limits  CBC  I-STAT TROPOININ, ED    Imaging Review Dg Chest 2 View  08/09/2015   CLINICAL DATA:  Acute shortness of breath and wheezing today.  EXAM: CHEST  2 VIEW  COMPARISON:  02/10/2015 and 10/13/2010 chest radiographs  FINDINGS: Cardiomegaly, hyperinflation, mild peribronchial thickening and basilar scarring again noted.  There is no evidence of focal airspace disease, pulmonary edema, suspicious pulmonary nodule/mass, pleural effusion, or pneumothorax. No acute bony abnormalities are identified.  Remote left-sided rib fractures are identified.  IMPRESSION: Cardiomegaly without evidence of acute cardiopulmonary disease.   Electronically Signed   By: Harmon Pier M.D.   On: 08/09/2015 15:36   I have personally reviewed and evaluated these images and lab results as part of my medical decision-making.   EKG Interpretation   Date/Time:  Tuesday August 09 2015 13:41:09 EDT Ventricular Rate:  86 PR Interval:    QRS Duration: 95 QT Interval:  376 QTC Calculation: 450 R Axis:   55 Text Interpretation:  SR no sig change from old Confirmed by Donnald Garre, MD,  Lebron Conners [phone]) on 08/09/2015 1:55:41 PM      MDM   Final diagnoses:  Chest pain, unspecified chest pain type   The patient presented with chest pain onset with exertion. She also had associated dyspnea. This improved with rest, aspirin and supplemental oxygen. Pain quality is suspicious for cardiac ischemic type disease. At this time patient has normal troponin and EKG. She will be admitted for cardiac enzyme monitoring and rule out of MI\ACS. At the time of my evaluation the patient was chest pain-free.    Arby Barrette, MD 08/09/15 [phone]

## 2015-08-09 NOTE — ED Notes (Signed)
MD Pheiffer notified that pt had a brief run of Ventricular Bigeminy.

## 2015-08-10 DIAGNOSIS — R079 Chest pain, unspecified: Secondary | ICD-10-CM | POA: Diagnosis not present

## 2015-08-10 DIAGNOSIS — N179 Acute kidney failure, unspecified: Secondary | ICD-10-CM | POA: Diagnosis not present

## 2015-08-10 DIAGNOSIS — I10 Essential (primary) hypertension: Secondary | ICD-10-CM | POA: Diagnosis not present

## 2015-08-10 DIAGNOSIS — A084 Viral intestinal infection, unspecified: Secondary | ICD-10-CM

## 2015-08-10 LAB — BASIC METABOLIC PANEL
Anion gap: 7 (ref 5–15)
BUN: 16 mg/dL (ref 6–20)
CALCIUM: 8.5 mg/dL — AB (ref 8.9–10.3)
CO2: 25 mmol/L (ref 22–32)
CREATININE: 1.03 mg/dL — AB (ref 0.44–1.00)
Chloride: 104 mmol/L (ref 101–111)
GFR calc non Af Amer: 48 mL/min — ABNORMAL LOW (ref >60)
GFR, EST AFRICAN AMERICAN: 55 mL/min — AB (ref >60)
GLUCOSE: 117 mg/dL — AB (ref 65–99)
Potassium: 4.1 mmol/L (ref 3.5–5.1)
Sodium: 136 mmol/L (ref 135–145)

## 2015-08-10 LAB — TROPONIN I

## 2015-08-10 NOTE — Discharge Summary (Addendum)
Physician Discharge Summary  Anna Gonzales NFA:[id number] DOB: [birth year] DOA: 08/09/2015  PCP: Ginette Otto, MD  Admit date: 08/09/2015 Discharge date: 08/10/2015  Recommendations for Outpatient Follow-up:  1. Follow-up with cardiology within 1-2 weeks of discharge for reevaluation, and advised to come to the ER if her chest pressure returns 2. Follow-up with primary care doctor in 1-2 weeks for repeat BMP to check creatinine, potassium  Discharge Diagnoses:  Principal Problem:   Acute kidney injury Active Problems:   Chest pain   Dehydration   Hypertension   Gastroenteritis and colitis, viral   Discharge Condition: Stable, improved  Diet recommendation: Healthy heart  Wt Readings from Last 3 Encounters:  08/10/15 51.075 kg (112 lb 9.6 oz)    History of present illness:  The patient is an 28 are old female who presented from home who has history of high blood pressure. She develops nausea, vomiting, and diarrhea with inability to stay hydrated. She became dehydrated and while she was cleaning her blinds in her house, she suddenly developed some substernal chest pressure with mild shortness of breath. She called 911 and the agent told her to take aspirin and come to the emergency department. In the emergency department, her troponin was negative, her EKG demonstrated no acute ischemic changes, she was seen by cardiology. Her creatinine was elevated and she was given IV fluids.  Hospital Course:   Chest pressure with possible mild ST segment depressions in V4 through V6. Her troponins were negative. Her chest pressure resolved with IV fluids.  She was advised to call 911 if she has recurrent chest pressure, and to follow-up with cardiology in approximately 2 weeks for reevaluation.  Acute kidney injury secondary to dehydration from probable viral gastroenteritis. Her creatinine trended down from 1.5-1 with IV fluids. She was able to tolerate a healthy heart diet and was  ambulating without assistance. She was advised to not take her hydrochlorothiazide or ACEI for the next 2 days, but if she is able to stay hydrated she may resume her blood pressure medications in that time.  She should follow-up with her primary care doctor in approximately 1-2 weeks for repeat BMP to check creatinine and potassium.  Hypokalemia secondary to GI losses, resolved with oral potassium supplementation.  Hypertension, blood pressures were in the 120s to 130s. She was continued on Norvasc, but her benazepril and HCTZ were discontinued secondary to acute kidney injury. She was advised to resume these medications in 2 days if she is able to stay hydrated without vomiting or diarrhea.   Procedures:  Chest x-ray  Consultations:  Cardiology  Discharge Exam: Filed Vitals:   08/10/15 0619  BP: 136/68  Pulse: 70  Temp: 98 F (36.7 C)  Resp: 18   Filed Vitals:   08/09/15 1600 08/09/15 1741 08/09/15 2102 08/10/15 0619  BP: 127/46 141/48 144/59 136/68  Pulse: 74  87 70  Temp:  97.8 F (36.6 C) 97.7 F (36.5 C) 98 F (36.7 C)  TempSrc:   Oral Oral  Resp: Height:   (1.626 m)    Weight:  51.075 kg (112 lb 9.6 oz)  51.075 kg (112 lb 9.6 oz)  SpO2: 93% 96% 96% 95%    General: Adult female, well-appearing, no acute distress Cardiovascular: Regular rate and rhythm, no murmurs, rubs, or gallops, telemetry: Normal sinus rhythm with occasional PVCs Respiratory: Clear to auscultation bilaterally Abdomen: NABS, soft, nondistended, nontender MSK: No lower extremity edema, normal tone and bulk  Discharge  Instructions      Discharge Instructions    Call MD for:  difficulty breathing, headache or visual disturbances    Complete by:  As directed      Call MD for:  extreme fatigue    Complete by:  As directed      Call MD for:  hives    Complete by:  As directed      Call MD for:  persistant dizziness or light-headedness    Complete by:  As directed      Call  MD for:  persistant nausea and vomiting    Complete by:  As directed      Call MD for:  severe uncontrolled pain    Complete by:  As directed      Call MD for:  temperature >100.4    Complete by:  As directed      Diet - low sodium heart healthy    Complete by:  As directed      Discharge instructions    Complete by:  As directed   You were hospitalized with dehydration.  Your chest pressure may have been related to dehydration from your viral illness.  Please do not take your HCTZ or benazepril today or tomorrow, but if you are still eating and drinking okay and not having diarrhea or vomiting, please restart these medications on Friday.  Have your primary care doctor check your blood work again in about 1-2 weeks to make sure your potassium and kidney function are still okay.  If you have chest pain, please call 911 right away.  You also have the number to the cardiology office if you have recurrent chest pressure, particularly when you are exerting yourself.     Increase activity slowly    Complete by:  As directed             Medication List    STOP taking these medications        benazepril 10 MG tablet  Commonly known as:  LOTENSIN     hydrochlorothiazide 25 MG tablet  Commonly known as:  HYDRODIURIL      TAKE these medications        amLODipine 10 MG tablet  Commonly known as:  NORVASC  Take 10 mg by mouth daily.     aspirin 325 MG tablet  Take 325 mg by mouth daily.     CALCIUM PO  Take 1 tablet by mouth daily.     cholecalciferol 1000 UNITS tablet  Commonly known as:  VITAMIN D  Take 2,000 Units by mouth daily.     EYE VITAMINS Caps  Take 1 capsule by mouth daily.     SYSTANE OP  Apply 1 drop to eye daily. For both eyes     VITAMIN B 12 PO  Take 1 tablet by mouth daily.     vitamin E 400 UNIT capsule  Take 400 Units by mouth daily.       Follow-up Information    Follow up with Nahser, Deloris Ping, MD.   Specialty:  Cardiology   Why:  Please contact  cardiology if continue to have symptom after discharge   Contact information:   [address] [place] [address] [phone]       Follow up with Ginette Otto, MD. Schedule an appointment as soon as possible for a visit in 1 week.   Specialty:  Internal Medicine   Contact information:   [address] [employer]  [address] [place] [address] [phone]        The results of significant diagnostics from this hospitalization (including imaging, microbiology, ancillary and laboratory) are listed below for reference.    Significant Diagnostic Studies: Dg Chest 2 View  08/09/2015   CLINICAL DATA:  Acute shortness of breath and wheezing today.  EXAM: CHEST  2 VIEW  COMPARISON:  02/10/2015 and 10/13/2010 chest radiographs  FINDINGS: Cardiomegaly, hyperinflation, mild peribronchial thickening and basilar scarring again noted.  There is no evidence of focal airspace disease, pulmonary edema, suspicious pulmonary nodule/mass, pleural effusion, or pneumothorax. No acute bony abnormalities are identified.  Remote left-sided rib fractures are identified.  IMPRESSION: Cardiomegaly without evidence of acute cardiopulmonary disease.   Electronically Signed   By: Harmon Pier M.D.   On: 08/09/2015 15:36    Microbiology: No results found for this or any previous visit (from the past 240 hour(s)).   Labs: Basic Metabolic Panel:  Recent Labs Lab 08/09/15 1500 08/09/15 1832 08/10/15 0809  NA 135  --  136  K 3.1*  --  4.1  CL 100*  --  104  CO2 26  --  25  GLUCOSE 168*  --  117*  BUN 19  --  16  CREATININE 1.33* 1.51* 1.03*  CALCIUM 8.6*  --  8.5*   Liver Function Tests: No results for input(s): AST, ALT, ALKPHOS, BILITOT, PROT, ALBUMIN in the last 168 hours. No results for input(s): LIPASE, AMYLASE in the last 168 hours. No results for input(s): AMMONIA in the last 168 hours. CBC:  Recent Labs Lab 08/09/15 1500 08/09/15 1832  WBC 6.3 5.2  HGB 12.2 12.4  HCT  36.0 36.8  MCV 81.4 82.0  PLT 216 231   Cardiac Enzymes:  Recent Labs Lab 08/09/15 1832 08/09/15 2016 08/09/15 2335  TROPONINI <0.03 <0.03 <0.03   BNP: BNP (last 3 results) No results for input(s): BNP in the last 8760 hours.  ProBNP (last 3 results) No results for input(s): PROBNP in the last 8760 hours.  CBG: No results for input(s): GLUCAP in the last 168 hours.  Time coordinating discharge: 35 minutes  Signed:  Phoebie Shad  [facility] 08/10/2015, 10:19 AM

## 2015-08-10 NOTE — Progress Notes (Signed)
Pt creatinine level at 1.03. Will update MD . Bil. Lower lung lobes fine crackles noted.

## 2015-08-10 NOTE — Discharge Instructions (Signed)

## 2015-10-31 ENCOUNTER — Emergency Department (HOSPITAL_COMMUNITY): Payer: Medicare Other

## 2015-10-31 ENCOUNTER — Emergency Department (HOSPITAL_COMMUNITY)
Admission: EM | Admit: 2015-10-31 | Discharge: 2015-11-01 | Disposition: A | Discharge status: Home / Self Care | Payer: Medicare Other | Attending: Emergency Medicine | Admitting: Emergency Medicine

## 2015-10-31 ENCOUNTER — Encounter (HOSPITAL_COMMUNITY): Payer: Self-pay | Admitting: Emergency Medicine

## 2015-10-31 DIAGNOSIS — Z7982 Long term (current) use of aspirin: Secondary | ICD-10-CM | POA: Insufficient documentation

## 2015-10-31 DIAGNOSIS — I10 Essential (primary) hypertension: Secondary | ICD-10-CM | POA: Diagnosis not present

## 2015-10-31 DIAGNOSIS — Z91040 Latex allergy status: Secondary | ICD-10-CM | POA: Insufficient documentation

## 2015-10-31 DIAGNOSIS — Z79899 Other long term (current) drug therapy: Secondary | ICD-10-CM | POA: Diagnosis not present

## 2015-10-31 DIAGNOSIS — R531 Weakness: Secondary | ICD-10-CM | POA: Diagnosis not present

## 2015-10-31 DIAGNOSIS — Z880 Allergy status to penicillin: Secondary | ICD-10-CM | POA: Diagnosis not present

## 2015-10-31 DIAGNOSIS — R0600 Dyspnea, unspecified: Secondary | ICD-10-CM | POA: Diagnosis not present

## 2015-10-31 DIAGNOSIS — R5383 Other fatigue: Secondary | ICD-10-CM | POA: Diagnosis present

## 2015-10-31 LAB — URINALYSIS, ROUTINE W REFLEX MICROSCOPIC
Bilirubin Urine: NEGATIVE
GLUCOSE, UA: NEGATIVE mg/dL
HGB URINE DIPSTICK: NEGATIVE
Ketones, ur: NEGATIVE mg/dL
Leukocytes, UA: NEGATIVE
Nitrite: NEGATIVE
Protein, ur: NEGATIVE mg/dL
SPECIFIC GRAVITY, URINE: 1.007 (ref 1.005–1.030)
Urobilinogen, UA: 1 mg/dL (ref 0.0–1.0)
pH: 6.5 (ref 5.0–8.0)

## 2015-10-31 LAB — CBC WITH DIFFERENTIAL/PLATELET
Basophils Absolute: 0 10*3/uL (ref 0.0–0.1)
Basophils Relative: 0 %
EOS ABS: 0 10*3/uL (ref 0.0–0.7)
Eosinophils Relative: 0 %
HEMATOCRIT: 33.8 % — AB (ref 36.0–46.0)
HEMOGLOBIN: 11.1 g/dL — AB (ref 12.0–15.0)
LYMPHS ABS: 1.1 10*3/uL (ref 0.7–4.0)
Lymphocytes Relative: 9 %
MCH: 25.3 pg — AB (ref 26.0–34.0)
MCHC: 32.8 g/dL (ref 30.0–36.0)
MCV: 77 fL — ABNORMAL LOW (ref 78.0–100.0)
MONOS PCT: 9 %
Monocytes Absolute: 1.1 10*3/uL — ABNORMAL HIGH (ref 0.1–1.0)
NEUTROS ABS: 9.9 10*3/uL — AB (ref 1.7–7.7)
NEUTROS PCT: 82 %
Platelets: 421 10*3/uL — ABNORMAL HIGH (ref 150–400)
RBC: 4.39 MIL/uL (ref 3.87–5.11)
RDW: 12.8 % (ref 11.5–15.5)
WBC: 12.1 10*3/uL — ABNORMAL HIGH (ref 4.0–10.5)

## 2015-10-31 LAB — COMPREHENSIVE METABOLIC PANEL
ALK PHOS: 89 U/L (ref 38–126)
ALT: 10 U/L — AB (ref 14–54)
ANION GAP: 13 (ref 5–15)
AST: 22 U/L (ref 15–41)
Albumin: 2.6 g/dL — ABNORMAL LOW (ref 3.5–5.0)
BILIRUBIN TOTAL: 0.5 mg/dL (ref 0.3–1.2)
BUN: 17 mg/dL (ref 6–20)
CO2: 26 mmol/L (ref 22–32)
CREATININE: 0.86 mg/dL (ref 0.44–1.00)
Calcium: 8.5 mg/dL — ABNORMAL LOW (ref 8.9–10.3)
Chloride: 91 mmol/L — ABNORMAL LOW (ref 101–111)
GFR calc non Af Amer: 59 mL/min — ABNORMAL LOW (ref >60)
Glucose, Bld: 142 mg/dL — ABNORMAL HIGH (ref 65–99)
Potassium: 3.3 mmol/L — ABNORMAL LOW (ref 3.5–5.1)
SODIUM: 130 mmol/L — AB (ref 135–145)
Total Protein: 5.3 g/dL — ABNORMAL LOW (ref 6.5–8.1)

## 2015-10-31 LAB — BRAIN NATRIURETIC PEPTIDE: B Natriuretic Peptide: 197.6 pg/mL — ABNORMAL HIGH (ref 0.0–100.0)

## 2015-10-31 LAB — TROPONIN I: Troponin I: <0.03 ng/mL (ref <0.031)

## 2015-10-31 MED ORDER — SODIUM CHLORIDE 0.9 % IV BOLUS (SEPSIS)
1000.0000 mL | Freq: Once | INTRAVENOUS | Status: AC
Start: 2015-10-31 — End: 2015-10-31
  Administered 2015-10-31: 1000 mL via INTRAVENOUS

## 2015-10-31 MED ORDER — FUROSEMIDE 10 MG/ML IJ SOLN
20.0000 mg | Freq: Once | INTRAMUSCULAR | Status: AC
  Administered 2015-11-01: 20 mg via INTRAVENOUS
  Filled 2015-10-31: qty 2

## 2015-10-31 MED ORDER — FUROSEMIDE 20 MG PO TABS: 20.0000 mg | ORAL_TABLET | Freq: Every day | ORAL | Status: DC

## 2015-10-31 NOTE — ED Notes (Signed)
Family at bedside. °

## 2015-10-31 NOTE — ED Provider Notes (Signed)
CSN: [id number]     Arrival date & time 10/31/15  1757 History   First MD Initiated Contact with Patient 10/31/15 1807     Chief Complaint  Patient presents with   Fatigue    The patient said she has been having fatigue for four days.  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  She has not been taking care of herself, not eating well.  She has had normal BM today.     (Consider location/radiation/quality/duration/timing/severity/associated sxs/prior Treatment) HPI History presents with concern of dyspnea, fatigue. Fatigue seems to have begun about 4 days ago, since onset has been worsening. Dyspnea symptoms began today, but previously has been episodic. It is now constant. There is no associated pain, abdominal or chest. Patient has had similar episodes of fatigue and dyspnea in the past, though none recently. 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 Since onset, no clear alleviating, or exacerbating factors for the fatigue or the dyspnea. No exertional or pleuritic pain. No nausea, vomiting, fever, chills.  Past Medical History  Diagnosis Date   Hypertension    Past Surgical History  Procedure Laterality Date   Orif distal radius fracture     History reviewed. No pertinent family history. Social History  Substance Use Topics   Smoking status: Never Smoker    Smokeless tobacco: None   Alcohol Use: No   OB History    No data available     Review of Systems  Constitutional:       Per HPI, otherwise negative  HENT:       Per HPI, otherwise negative  Respiratory:       Per HPI, otherwise negative  Cardiovascular:       Per HPI, otherwise negative  Gastrointestinal: Negative for vomiting.  Endocrine:       Negative aside from HPI  Genitourinary:       Neg aside from HPI   Musculoskeletal:       Per HPI, otherwise negative  Skin: Negative.   Neurological:  Positive for weakness. Negative for syncope.      Allergies  Sulfa antibiotics; Latex; and Penicillins  Home Medications   Prior to Admission medications   Medication Sig Start Date End Date Taking? Authorizing Provider  amLODipine (NORVASC) 10 MG tablet Take 10 mg by mouth daily.   Yes Historical Provider, MD  aspirin 325 MG tablet Take 325 mg by mouth daily.   Yes Historical Provider, MD  CALCIUM PO Take 1 tablet by mouth daily.   Yes Historical Provider, MD  cholecalciferol (VITAMIN D) 1000 UNITS tablet Take 2,000 Units by mouth daily.   Yes Historical Provider, MD  Cyanocobalamin (VITAMIN B 12 PO) Take 1 tablet by mouth daily.   Yes Historical Provider, MD  Multiple Vitamins-Minerals (EYE VITAMINS) CAPS Take 1 capsule by mouth daily.   Yes Historical Provider, MD  Polyethyl Glycol-Propyl Glycol (SYSTANE OP) Apply 1 drop to eye daily. For both eyes   Yes Historical Provider, MD  furosemide (LASIX) 20 MG tablet Take 1 tablet (20 mg total) by mouth daily. 10/31/15   Gerhard Munch, MD   BP 135/55 mmHg   Pulse 79   Temp(Src) 97.4 F (36.3 C) (Oral)   Resp 26   SpO2 92% Physical Exam  Constitutional: She is oriented to person, place, and time. She appears well-developed and well-nourished. She has a sickly appearance.  HENT:  Head:  Normocephalic and atraumatic.  Eyes: Conjunctivae and EOM are normal.  Cardiovascular: Normal rate and regular rhythm.   Pulmonary/Chest: No stridor. Tachypnea noted. She has decreased breath sounds.  Abdominal: She exhibits no distension.  Musculoskeletal: She exhibits no edema.  Neurological: She is alert and oriented to person, place, and time. No cranial nerve deficit.  Skin: Skin is warm and dry.  Psychiatric: She has a normal mood and affect.  Nursing note and vitals reviewed.   ED Course  Procedures (including critical care time) Labs Review Labs Reviewed  COMPREHENSIVE METABOLIC PANEL - Abnormal; Notable for the following:    Sodium 130  (*)    Potassium 3.3 (*)    Chloride 91 (*)    Glucose, Bld 142 (*)    Calcium 8.5 (*)    Total Protein 5.3 (*)    Albumin 2.6 (*)    ALT 10 (*)    GFR calc non Af Amer 59 (*)    All other components within normal limits  CBC WITH DIFFERENTIAL/PLATELET - Abnormal; Notable for the following:    WBC 12.1 (*)    Hemoglobin 11.1 (*)    HCT 33.8 (*)    MCV 77.0 (*)    MCH 25.3 (*)    Platelets 421 (*)    Neutro Abs 9.9 (*)    Monocytes Absolute 1.1 (*)    All other components within normal limits  URINALYSIS, ROUTINE W REFLEX MICROSCOPIC (NOT AT [facility]) - Abnormal; Notable for the following:    APPearance CLOUDY (*)    All other components within normal limits  BRAIN NATRIURETIC PEPTIDE - Abnormal; Notable for the following:    B Natriuretic Peptide 197.6 (*)    All other components within normal limits  TROPONIN I    Imaging Review Dg Chest 2 View  10/31/2015  CLINICAL DATA:  Shortness of breath and weakness, 4 days duration. EXAM: CHEST  2 VIEW COMPARISON:  08/09/2015 and previous FINDINGS: Heart size is normal. There is atherosclerosis of the aorta. There is chronic blunting of the posterior costophrenic angles. The lungs show slightly increased chronic interstitial markings but there is no evidence of focal active infiltrate, mass or collapse. There are old rib fractures on the left. No acute bone finding. IMPRESSION: Atherosclerosis of the aorta. Mild chronic interstitial lung markings. No focal or active process otherwise. Chronic blunting of the posterior costophrenic angles presumed secondary to scarring. Electronically Signed   By: Paulina FusiMark  Shogry M.D.   On: 10/31/2015 19:36   I have personally reviewed and evaluated these images and lab results as part of my medical decision-making. Pulse ox symmetry 97% with nasal cannula abnormal  11:26 PM I had a lengthy conversation with the patient and her companion about all findings per We discussed the minor follicular abnormalities,  slight elevation leukocytosis, and the elevated BNP. Patient states that she feels essentially better with fluid resuscitation. Pulse oxygenation is mid 90% without supplementation, 98% with nasal cannula. We discussed this am particularly in light of elevated BNP. Patient has very strong preference for discharge, to care for her husband, and to follow-up with primary care tomorrow. We discussed the necessity of discussed the lab findings, consideration of medication, including Lasix with physician tomorrow.  MDM   Final diagnoses:  Weakness  Dyspnea   Elderly female presents with weakness, mild dyspnea. Here, no evidence for ongoing ACS, bacteremia, pneumonia, sepsis. Patient does have minor elevation in BNP. Patient improved with fluid resuscitation suggesting dehydration as contributory. With some  suspicion for decreased cardiac function, anchored to the patient admitted for further evaluation, management. Patient had strong preference for discharge, and as she has follow-up within 24 hours, this was accommodated.  Gerhard Munch, MD 10/31/15 [phone]

## 2015-10-31 NOTE — Discharge Instructions (Signed)
As discussed, it is very important that you keep tomorrow's appointment with your physician.  Please discussed tonight evaluation, and the laboratory findings.  The most substantial abnormalities elevation in your BNP.  Please discuss this with your physician.  You may also discuss additional outpatient evaluation, possibly including echocardiogram.  Return here for any concerning changes in your condition.

## 2015-10-31 NOTE — ED Notes (Signed)
Family at bedside.

## 2015-10-31 NOTE — ED Notes (Signed)
The patient said she has been having fatigue for four days.  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  She has not been taking care of herself, not eating well.  She has had normal BM today.  The patient denies any diarrhea, nausea, or vomiting.  She did say she was throwing up all day Saturday and could not keep anything down.  She is complaining of abdominal pain but only if she touches it.

## 2015-11-01 DIAGNOSIS — R531 Weakness: Secondary | ICD-10-CM | POA: Diagnosis not present

## 2015-11-08 ENCOUNTER — Encounter (HOSPITAL_COMMUNITY): Payer: Self-pay | Admitting: Emergency Medicine

## 2015-11-08 ENCOUNTER — Inpatient Hospital Stay (HOSPITAL_COMMUNITY)
Admission: EM | Admit: 2015-11-08 | Discharge: 2015-11-11 | DRG: 292 | Disposition: A | Discharge status: Home / Self Care | Payer: Medicare Other | Attending: Family Medicine | Admitting: Family Medicine

## 2015-11-08 DIAGNOSIS — R0602 Shortness of breath: Secondary | ICD-10-CM

## 2015-11-08 DIAGNOSIS — I509 Heart failure, unspecified: Secondary | ICD-10-CM

## 2015-11-08 DIAGNOSIS — E8809 Other disorders of plasma-protein metabolism, not elsewhere classified: Secondary | ICD-10-CM | POA: Diagnosis present

## 2015-11-08 DIAGNOSIS — N390 Urinary tract infection, site not specified: Secondary | ICD-10-CM | POA: Diagnosis present

## 2015-11-08 DIAGNOSIS — R7989 Other specified abnormal findings of blood chemistry: Secondary | ICD-10-CM

## 2015-11-08 DIAGNOSIS — I110 Hypertensive heart disease with heart failure: Secondary | ICD-10-CM | POA: Diagnosis not present

## 2015-11-08 DIAGNOSIS — I5031 Acute diastolic (congestive) heart failure: Secondary | ICD-10-CM | POA: Insufficient documentation

## 2015-11-08 DIAGNOSIS — N12 Tubulo-interstitial nephritis, not specified as acute or chronic: Secondary | ICD-10-CM | POA: Diagnosis present

## 2015-11-08 DIAGNOSIS — E871 Hypo-osmolality and hyponatremia: Secondary | ICD-10-CM | POA: Diagnosis present

## 2015-11-08 DIAGNOSIS — B9620 Unspecified Escherichia coli [E. coli] as the cause of diseases classified elsewhere: Secondary | ICD-10-CM | POA: Diagnosis present

## 2015-11-08 DIAGNOSIS — I10 Essential (primary) hypertension: Secondary | ICD-10-CM | POA: Diagnosis present

## 2015-11-08 DIAGNOSIS — I472 Ventricular tachycardia: Secondary | ICD-10-CM

## 2015-11-08 DIAGNOSIS — R778 Other specified abnormalities of plasma proteins: Secondary | ICD-10-CM | POA: Diagnosis present

## 2015-11-08 DIAGNOSIS — R000 Tachycardia, unspecified: Secondary | ICD-10-CM

## 2015-11-08 NOTE — ED Provider Notes (Signed)
CSN: [id number]     Arrival date & time 11/08/15  2341 History  By signing my name below, I, Phillis Haggis, attest that this documentation has been prepared under the direction and in the presence of Loren Racer, MD. Electronically Signed: Phillis Haggis, ED Scribe. 11/08/2015. 1:15 AM.  Chief Complaint  Patient presents with   Shortness of Breath   Edema   The history is provided by the patient and the EMS personnel. No language interpreter was used.   HPI Comments: Anna Gonzales is a 81 y.o. Female with a hx of HTN and SOB brought in by EMS who presents to the Emergency Department complaining of SOB and bilateral lower leg edema onset PTA. Pt states that her symptoms started when she was cooking in the kitchen but is not sure what time they began. Per nurse, EMS states that pt was tachypneic and had significant pulmonary edema on the right side due to crackles present in lung fields. He states that pt has a prescription for Lasix for her edema but has not filled it. She reports associated nausea and vomiting. Pt was given nitro, Zofran, and BiPAP en route. She reports feeling "much better" since arriving to the ED. Pt was recently discharged from the hospital for similar symptoms. She denies worsening or alleviating factors for her SOB. She denies fever, chills, chest pain, cough, or abdominal pain.   Past Medical History  Diagnosis Date   Hypertension    Past Surgical History  Procedure Laterality Date   Orif distal radius fracture     Family History  Problem Relation Age of Onset   Stroke Mother    Kidney disease Mother    Arrhythmia Mother    Asthma Father    Asthma Sister    Lung cancer Sister    Heart attack Brother    Social History  Substance Use Topics   Smoking status: Never Smoker    Smokeless tobacco: None   Alcohol Use: No   OB History    No data available     Review of Systems  Constitutional: Negative for fever and chills.  HENT:  Negative for congestion, sinus pressure and sore throat.   Eyes: Negative for visual disturbance.  Respiratory: Positive for shortness of breath. Negative for cough and wheezing.   Cardiovascular: Positive for leg swelling. Negative for chest pain.  Gastrointestinal: Positive for nausea and vomiting. Negative for abdominal pain and diarrhea.  Genitourinary: Negative for dysuria, frequency and flank pain.  Musculoskeletal: Negative for back pain, neck pain and neck stiffness.  Skin: Negative for rash and wound.  Neurological: Negative for dizziness, weakness, light-headedness, numbness and headaches.  All other systems reviewed and are negative.     Allergies  Sulfa antibiotics; Latex; and Penicillins  Home Medications   Prior to Admission medications   Medication Sig Start Date End Date Taking? Authorizing Provider  amLODipine (NORVASC) 10 MG tablet Take 10 mg by mouth daily.   Yes Historical Provider, MD  aspirin 325 MG tablet Take 325 mg by mouth daily.   Yes Historical Provider, MD  CALCIUM PO Take 1 tablet by mouth daily.   Yes Historical Provider, MD  cholecalciferol (VITAMIN D) 1000 UNITS tablet Take 2,000 Units by mouth daily.   Yes Historical Provider, MD  Cyanocobalamin (VITAMIN B 12 PO) Take 1 tablet by mouth daily.   Yes Historical Provider, MD  Multiple Vitamins-Minerals (EYE VITAMINS) CAPS Take 1 capsule by mouth daily.   Yes Historical Provider, MD  Polyethyl Glycol-Propyl Glycol (SYSTANE OP) Apply 1 drop to eye daily. For both eyes   Yes Historical Provider, MD  cefUROXime (CEFTIN) 250 MG tablet Take 1 tablet (250 mg total) by mouth 2 (two) times daily with a meal. 11/11/15   Rhetta Mura, MD  furosemide (LASIX) 40 MG tablet Take 1 tablet (40 mg total) by mouth daily. 11/11/15   Rhetta Mura, MD  lisinopril (PRINIVIL,ZESTRIL) 2.5 MG tablet Take 1 tablet (2.5 mg total) by mouth daily. 11/11/15   Rhetta Mura, MD   BP 139/57 mmHg   Pulse 99    Temp(Src) 98.1 F (36.7 C) (Oral)   Resp 18   Ht 5\' 1"  (1.549 m)   Wt 106 lb 8 oz (48.308 kg)   BMI 20.13 kg/m2   SpO2 96% Physical Exam  Constitutional: She is oriented to person, place, and time. She appears well-developed and well-nourished. No distress.  HENT:  Head: Normocephalic and atraumatic.  Mouth/Throat: Oropharynx is clear and moist.  Eyes: EOM are normal. Pupils are equal, round, and reactive to light.  Neck: Normal range of motion. Neck supple.  Cardiovascular: Normal rate and regular rhythm.   Pulmonary/Chest: Effort normal. No respiratory distress. She has no wheezes. She has rales. She exhibits no tenderness.  Basilar crackles bilaterally  Abdominal: Soft. Bowel sounds are normal. She exhibits no distension and no mass. There is no tenderness. There is no rebound and no guarding.  Musculoskeletal: Normal range of motion. She exhibits edema (1+ bilateral pitting edema.). She exhibits no tenderness.  Neurological: She is alert and oriented to person, place, and time.  Moves all extremities without deficit. Sensation is fully intact.  Skin: Skin is warm and dry. No rash noted. No erythema.  Psychiatric: She has a normal mood and affect. Her behavior is normal.  Nursing note and vitals reviewed.   ED Course  Procedures (including critical care time) DIAGNOSTIC STUDIES: Oxygen Saturation is 92% on RA, low by my interpretation.    COORDINATION OF CARE: 11:59 PM-Discussed treatment plan which includes chest x-ray and labs with pt at bedside and pt agreed to plan.   2:03 AM- Discussed treatment plan which includes admission to the hospital with pt at bedside and pt agreed to plan.  Labs Review Labs Reviewed  CBC WITH DIFFERENTIAL/PLATELET - Abnormal; Notable for the following:    WBC 17.6 (*)    Hemoglobin 10.8 (*)    HCT 32.7 (*)    MCV 76.6 (*)    MCH 25.3 (*)    Neutro Abs 14.8 (*)    Monocytes Absolute 1.5 (*)    All other components within normal limits   COMPREHENSIVE METABOLIC PANEL - Abnormal; Notable for the following:    Sodium 124 (*)    Chloride 90 (*)    Glucose, Bld 134 (*)    Total Protein 5.4 (*)    Albumin 2.4 (*)    ALT 8 (*)    GFR calc non Af Amer 60 (*)    All other components within normal limits  BRAIN NATRIURETIC PEPTIDE - Abnormal; Notable for the following:    B Natriuretic Peptide 232.1 (*)    All other components within normal limits  TROPONIN I - Abnormal; Notable for the following:    Troponin I 0.05 (*)    All other components within normal limits  URINALYSIS, ROUTINE W REFLEX MICROSCOPIC (NOT AT [facility]) - Abnormal; Notable for the following:    APPearance CLOUDY (*)    Hgb urine dipstick SMALL (*)  Leukocytes, UA MODERATE (*)    All other components within normal limits  URINE MICROSCOPIC-ADD ON - Abnormal; Notable for the following:    Squamous Epithelial / LPF 0-5 (*)    Bacteria, UA MANY (*)    All other components within normal limits  OSMOLALITY, URINE - Abnormal; Notable for the following:    Osmolality, Ur 201 (*)    All other components within normal limits  CBC - Abnormal; Notable for the following:    WBC 15.7 (*)    Hemoglobin 10.4 (*)    HCT 31.5 (*)    MCV 76.8 (*)    MCH 25.4 (*)    All other components within normal limits  BASIC METABOLIC PANEL - Abnormal; Notable for the following:    Sodium 126 (*)    Chloride 91 (*)    Glucose, Bld 117 (*)    GFR calc non Af Amer 59 (*)    All other components within normal limits  TROPONIN I - Abnormal; Notable for the following:    Troponin I 0.04 (*)    All other components within normal limits  TROPONIN I - Abnormal; Notable for the following:    Troponin I 0.07 (*)    All other components within normal limits  TROPONIN I - Abnormal; Notable for the following:    Troponin I 0.09 (*)    All other components within normal limits  OSMOLALITY - Abnormal; Notable for the following:    Osmolality 266 (*)    All other components within  normal limits  TROPONIN I - Abnormal; Notable for the following:    Troponin I 0.07 (*)    All other components within normal limits  TROPONIN I - Abnormal; Notable for the following:    Troponin I 0.11 (*)    All other components within normal limits  CBC WITH DIFFERENTIAL/PLATELET - Abnormal; Notable for the following:    WBC 16.6 (*)    Hemoglobin 10.6 (*)    HCT 31.7 (*)    MCV 76.9 (*)    MCH 25.7 (*)    Neutro Abs 14.3 (*)    Monocytes Absolute 1.5 (*)    All other components within normal limits  TROPONIN I - Abnormal; Notable for the following:    Troponin I 0.14 (*)    All other components within normal limits  MAGNESIUM - Abnormal; Notable for the following:    Magnesium 1.6 (*)    All other components within normal limits  COMPREHENSIVE METABOLIC PANEL - Abnormal; Notable for the following:    Sodium 128 (*)    Chloride 90 (*)    Glucose, Bld 121 (*)    Calcium 8.7 (*)    Total Protein 5.3 (*)    Albumin 2.3 (*)    ALT 9 (*)    GFR calc non Af Amer 53 (*)    All other components within normal limits  TROPONIN I - Abnormal; Notable for the following:    Troponin I 0.05 (*)    All other components within normal limits  TROPONIN I - Abnormal; Notable for the following:    Troponin I 0.06 (*)    All other components within normal limits  TROPONIN I - Abnormal; Notable for the following:    Troponin I 0.08 (*)    All other components within normal limits  BASIC METABOLIC PANEL - Abnormal; Notable for the following:    Sodium 130 (*)    Potassium 3.2 (*)  Chloride 89 (*)    CO2 34 (*)    GFR calc non Af Amer 54 (*)    All other components within normal limits  CBC - Abnormal; Notable for the following:    WBC 16.8 (*)    Hemoglobin 10.5 (*)    HCT 32.6 (*)    MCV 77.8 (*)    MCH 25.1 (*)    All other components within normal limits  URINE CULTURE  LACTIC ACID, PLASMA  MAGNESIUM  SODIUM, URINE, RANDOM  CREATININE, URINE, RANDOM  MAGNESIUM     Imaging Review Dg Chest Port 1 View  11/21/2015  CLINICAL DATA:  Shortness of Breath EXAM: PORTABLE CHEST 1 VIEW COMPARISON:  Chest CT November 18, 2015 and chest radiograph November 17, 2015 FINDINGS: There is airspace consolidation in the left mid lower lung zones. There is patchy airspace consolidation in the right base. There is a small left effusion. There is cardiomegaly mild pulmonary venous hypertension. There is atherosclerotic calcification aorta. No bone lesions. There is thoracolumbar levoscoliosis. IMPRESSION: Airspace consolidation bilaterally. Question progression of congestive heart failure versus superimposed pneumonia. Both entities may exist concurrently. Electronically Signed   By: Bretta BangWilliam  Woodruff III M.D.   On: 11/21/2015 08:39   Dg Abd 2 Views  11/20/2015  CLINICAL DATA:  Abdominal distension EXAM: ABDOMEN - 2 VIEW COMPARISON:  None. FINDINGS: No disproportionately dilated small bowel loops or appreciable air-fluid levels. Minimal colonic stool volume. No evidence of pneumatosis or pneumoperitoneum. Pleural effusions are seen in both lung bases. Atherosclerotic abdominal aorta. Tiny round calcification overlies the right bladder, nonspecific, likely a calcified venous phlebolith, cannot exclude a distal right ureteral stone. IMPRESSION: 1. Nonobstructive bowel gas pattern. 2. Small bilateral pleural effusions. Electronically Signed   By: Delbert PhenixJason A Poff M.D.   On: 11/20/2015 13:29   I have personally reviewed and evaluated these images and lab results as part of my medical decision-making.   EKG Interpretation   Date/Time:  Tuesday November 08 2015 23:51:30 EST Ventricular Rate:  96 PR Interval:  120 QRS Duration: 97 QT Interval:  356 QTC Calculation: 450 R Axis:   61 Text Interpretation:  Sinus rhythm Borderline repolarization abnormality  ED PHYSICIAN INTERPRETATION AVAILABLE IN [facility] Confirmed by  TEST, Record ([id number]) on 11/09/2015 6:57:31 AM      MDM    Final diagnoses:  Shortness of breath  UTI (lower urinary tract infection)    I personally performed the services described in this documentation, which was scribed in my presence. The recorded information has been reviewed and is accurate.   Patient feeling much better on BiPAP and nitroglycerin in the ER. Vital signs remained stable. Patient does have evidence of UTI. Elevated BNP and hyponatremia. Discussed with hospitalist. Will admit.   Loren Raceravid Hiren Peplinski, MD 11/21/15 2215

## 2015-11-08 NOTE — ED Notes (Signed)
Patient here via EMS from home with complaint of shortness of breath starting tonight. Upon arrival EMS reports that patient was tachypneic and actively vomiting. EMS started IV and gave 4mg  IV Zofran. Pulmonary edema was then suspected by EMS because coarse crackles were heard in the right lung fields. 1 SL NTG 0.4mg  tab was given and patient was placed on CPAP for about 10 minutes. After that time, edema seemed to resolve and patient felt "much better". Patient also endorsed fatigue and fever. EMS gave 650mg  Tylenol to mitigate fever, but didn't measure temperature prior.

## 2015-11-09 ENCOUNTER — Inpatient Hospital Stay (HOSPITAL_COMMUNITY): Payer: Medicare Other

## 2015-11-09 ENCOUNTER — Emergency Department (HOSPITAL_COMMUNITY): Payer: Medicare Other

## 2015-11-09 ENCOUNTER — Encounter (HOSPITAL_COMMUNITY): Payer: Self-pay | Admitting: Family Medicine

## 2015-11-09 DIAGNOSIS — I509 Heart failure, unspecified: Secondary | ICD-10-CM

## 2015-11-09 DIAGNOSIS — I472 Ventricular tachycardia: Secondary | ICD-10-CM | POA: Diagnosis not present

## 2015-11-09 DIAGNOSIS — R0602 Shortness of breath: Secondary | ICD-10-CM | POA: Insufficient documentation

## 2015-11-09 DIAGNOSIS — I5031 Acute diastolic (congestive) heart failure: Secondary | ICD-10-CM | POA: Diagnosis not present

## 2015-11-09 DIAGNOSIS — R7989 Other specified abnormal findings of blood chemistry: Secondary | ICD-10-CM

## 2015-11-09 DIAGNOSIS — I10 Essential (primary) hypertension: Secondary | ICD-10-CM

## 2015-11-09 DIAGNOSIS — N390 Urinary tract infection, site not specified: Secondary | ICD-10-CM | POA: Diagnosis present

## 2015-11-09 DIAGNOSIS — I110 Hypertensive heart disease with heart failure: Secondary | ICD-10-CM | POA: Diagnosis present

## 2015-11-09 DIAGNOSIS — N12 Tubulo-interstitial nephritis, not specified as acute or chronic: Secondary | ICD-10-CM | POA: Diagnosis present

## 2015-11-09 DIAGNOSIS — B9620 Unspecified Escherichia coli [E. coli] as the cause of diseases classified elsewhere: Secondary | ICD-10-CM | POA: Diagnosis present

## 2015-11-09 DIAGNOSIS — E8809 Other disorders of plasma-protein metabolism, not elsewhere classified: Secondary | ICD-10-CM | POA: Diagnosis present

## 2015-11-09 DIAGNOSIS — E871 Hypo-osmolality and hyponatremia: Secondary | ICD-10-CM | POA: Diagnosis present

## 2015-11-09 DIAGNOSIS — R778 Other specified abnormalities of plasma proteins: Secondary | ICD-10-CM | POA: Diagnosis present

## 2015-11-09 LAB — URINALYSIS, ROUTINE W REFLEX MICROSCOPIC
BILIRUBIN URINE: NEGATIVE
GLUCOSE, UA: NEGATIVE mg/dL
KETONES UR: NEGATIVE mg/dL
Nitrite: NEGATIVE
PH: 5.5 (ref 5.0–8.0)
PROTEIN: NEGATIVE mg/dL
Specific Gravity, Urine: 1.008 (ref 1.005–1.030)

## 2015-11-09 LAB — TROPONIN I
TROPONIN I: 0.05 ng/mL — AB (ref <0.031)
TROPONIN I: 0.07 ng/mL — AB (ref <0.031)
Troponin I: 0.04 ng/mL — ABNORMAL HIGH (ref <0.031)
Troponin I: 0.07 ng/mL — ABNORMAL HIGH (ref <0.031)
Troponin I: 0.09 ng/mL — ABNORMAL HIGH (ref <0.031)
Troponin I: 0.11 ng/mL — ABNORMAL HIGH (ref <0.031)

## 2015-11-09 LAB — URINE MICROSCOPIC-ADD ON

## 2015-11-09 LAB — SODIUM, URINE, RANDOM: Sodium, Ur: 70 mmol/L

## 2015-11-09 LAB — CBC
HCT: 31.5 % — ABNORMAL LOW (ref 36.0–46.0)
HEMOGLOBIN: 10.4 g/dL — AB (ref 12.0–15.0)
MCH: 25.4 pg — AB (ref 26.0–34.0)
MCHC: 33 g/dL (ref 30.0–36.0)
MCV: 76.8 fL — AB (ref 78.0–100.0)
PLATELETS: 380 10*3/uL (ref 150–400)
RBC: 4.1 MIL/uL (ref 3.87–5.11)
RDW: 12.9 % (ref 11.5–15.5)
WBC: 15.7 10*3/uL — ABNORMAL HIGH (ref 4.0–10.5)

## 2015-11-09 LAB — BASIC METABOLIC PANEL
Anion gap: 7 (ref 5–15)
BUN: 13 mg/dL (ref 6–20)
CHLORIDE: 91 mmol/L — AB (ref 101–111)
CO2: 28 mmol/L (ref 22–32)
Calcium: 9.1 mg/dL (ref 8.9–10.3)
Creatinine, Ser: 0.86 mg/dL (ref 0.44–1.00)
GFR calc Af Amer: >60 mL/min (ref >60)
GFR calc non Af Amer: 59 mL/min — ABNORMAL LOW (ref >60)
GLUCOSE: 117 mg/dL — AB (ref 65–99)
POTASSIUM: 3.9 mmol/L (ref 3.5–5.1)
Sodium: 126 mmol/L — ABNORMAL LOW (ref 135–145)

## 2015-11-09 LAB — COMPREHENSIVE METABOLIC PANEL
ALK PHOS: 91 U/L (ref 38–126)
ALT: 8 U/L — AB (ref 14–54)
ALT: 9 U/L — ABNORMAL LOW (ref 14–54)
ANION GAP: 9 (ref 5–15)
AST: 18 U/L (ref 15–41)
AST: 24 U/L (ref 15–41)
Albumin: 2.3 g/dL — ABNORMAL LOW (ref 3.5–5.0)
Albumin: 2.4 g/dL — ABNORMAL LOW (ref 3.5–5.0)
Alkaline Phosphatase: 80 U/L (ref 38–126)
Anion gap: 7 (ref 5–15)
BILIRUBIN TOTAL: 0.4 mg/dL (ref 0.3–1.2)
BILIRUBIN TOTAL: 0.8 mg/dL (ref 0.3–1.2)
BUN: 13 mg/dL (ref 6–20)
BUN: 14 mg/dL (ref 6–20)
CALCIUM: 8.7 mg/dL — AB (ref 8.9–10.3)
CALCIUM: 9.1 mg/dL (ref 8.9–10.3)
CHLORIDE: 90 mmol/L — AB (ref 101–111)
CO2: 27 mmol/L (ref 22–32)
CO2: 29 mmol/L (ref 22–32)
CREATININE: 0.85 mg/dL (ref 0.44–1.00)
Chloride: 90 mmol/L — ABNORMAL LOW (ref 101–111)
Creatinine, Ser: 0.94 mg/dL (ref 0.44–1.00)
GFR, EST NON AFRICAN AMERICAN: 53 mL/min — AB (ref >60)
GFR, EST NON AFRICAN AMERICAN: 60 mL/min — AB (ref >60)
Glucose, Bld: 121 mg/dL — ABNORMAL HIGH (ref 65–99)
Glucose, Bld: 134 mg/dL — ABNORMAL HIGH (ref 65–99)
Potassium: 3.5 mmol/L (ref 3.5–5.1)
Potassium: 3.8 mmol/L (ref 3.5–5.1)
Sodium: 124 mmol/L — ABNORMAL LOW (ref 135–145)
Sodium: 128 mmol/L — ABNORMAL LOW (ref 135–145)
TOTAL PROTEIN: 5.3 g/dL — AB (ref 6.5–8.1)
TOTAL PROTEIN: 5.4 g/dL — AB (ref 6.5–8.1)

## 2015-11-09 LAB — OSMOLALITY, URINE: Osmolality, Ur: 201 mOsm/kg — ABNORMAL LOW (ref 300–900)

## 2015-11-09 LAB — CBC WITH DIFFERENTIAL/PLATELET
BASOS PCT: 0 %
Basophils Absolute: 0 10*3/uL (ref 0.0–0.1)
Eosinophils Absolute: 0 10*3/uL (ref 0.0–0.7)
Eosinophils Relative: 0 %
HEMATOCRIT: 32.7 % — AB (ref 36.0–46.0)
Hemoglobin: 10.8 g/dL — ABNORMAL LOW (ref 12.0–15.0)
LYMPHS ABS: 1.3 10*3/uL (ref 0.7–4.0)
LYMPHS PCT: 7 %
MCH: 25.3 pg — AB (ref 26.0–34.0)
MCHC: 33 g/dL (ref 30.0–36.0)
MCV: 76.6 fL — AB (ref 78.0–100.0)
MONO ABS: 1.5 10*3/uL — AB (ref 0.1–1.0)
MONOS PCT: 8 %
NEUTROS ABS: 14.8 10*3/uL — AB (ref 1.7–7.7)
NEUTROS PCT: 85 %
PLATELETS: 381 10*3/uL (ref 150–400)
RBC: 4.27 MIL/uL (ref 3.87–5.11)
RDW: 12.9 % (ref 11.5–15.5)
WBC: 17.6 10*3/uL — ABNORMAL HIGH (ref 4.0–10.5)

## 2015-11-09 LAB — LACTIC ACID, PLASMA: Lactic Acid, Venous: 1.1 mmol/L (ref 0.5–2.0)

## 2015-11-09 LAB — MAGNESIUM
MAGNESIUM: 1.8 mg/dL (ref 1.7–2.4)
Magnesium: 1.6 mg/dL — ABNORMAL LOW (ref 1.7–2.4)

## 2015-11-09 LAB — OSMOLALITY: OSMOLALITY: 266 mosm/kg — AB (ref 275–295)

## 2015-11-09 LAB — BRAIN NATRIURETIC PEPTIDE: B NATRIURETIC PEPTIDE 5: 232.1 pg/mL — AB (ref 0.0–100.0)

## 2015-11-09 LAB — CREATININE, URINE, RANDOM: CREATININE, URINE: 16.34 mg/dL

## 2015-11-09 MED ORDER — ENOXAPARIN SODIUM 30 MG/0.3ML ~~LOC~~ SOLN
30.0000 mg | SUBCUTANEOUS | Status: DC
  Administered 2015-11-09 – 2015-11-11 (×3): 30 mg via SUBCUTANEOUS
  Filled 2015-11-09 (×3): qty 0.3

## 2015-11-09 MED ORDER — SODIUM CHLORIDE 0.9 % IJ SOLN
3.0000 mL | Freq: Two times a day (BID) | INTRAMUSCULAR | Status: DC
  Administered 2015-11-09 – 2015-11-11 (×5): 3 mL via INTRAVENOUS

## 2015-11-09 MED ORDER — LEVOFLOXACIN IN D5W 500 MG/100ML IV SOLN
500.0000 mg | Freq: Once | INTRAVENOUS | Status: AC
  Administered 2015-11-09: 500 mg via INTRAVENOUS
  Filled 2015-11-09: qty 100

## 2015-11-09 MED ORDER — LEVOFLOXACIN IN D5W 250 MG/50ML IV SOLN
250.0000 mg | INTRAVENOUS | Status: DC
  Administered 2015-11-10 – 2015-11-11 (×2): 250 mg via INTRAVENOUS
  Filled 2015-11-09 (×3): qty 50

## 2015-11-09 MED ORDER — FUROSEMIDE 40 MG PO TABS
40.0000 mg | ORAL_TABLET | Freq: Every day | ORAL | Status: DC
  Administered 2015-11-09 – 2015-11-11 (×3): 40 mg via ORAL
  Filled 2015-11-09 (×3): qty 1

## 2015-11-09 MED ORDER — FUROSEMIDE 10 MG/ML IJ SOLN
40.0000 mg | Freq: Once | INTRAMUSCULAR | Status: AC
  Administered 2015-11-09: 40 mg via INTRAVENOUS
  Filled 2015-11-09: qty 4

## 2015-11-09 NOTE — Progress Notes (Signed)
11:21 AM I agree with HPI/GPe and A/P per Dr. Maryfrances Bunnellanford  81 y/o ? htn Prior colonoscopy 02/2004 Admitted last 07/2015 Viral Ge with AKI on that admit-Had CP and was supposed to follow up as OP with Cardiology  States that in the past month has come to the emergency room X3 although I only see one emergency room visit dated on the 14th for debility She was given Lasix on discharge from the emergency room at that time Asthma that she came to the emergency room she stopped when she was catheterized and started opacity in Currently in no distress Chest pain no nausea no vomiting Tolerating diet but hasn't used and probably in the past 2-3 days and has been drinking mainly fluids        Patient Active Problem List   Diagnosis Date Noted   Shortness of breath 11/09/2015   Acute CHF (congestive heart failure) (HCC) 11/09/2015   UTI (lower urinary tract infection) 11/09/2015   Elevated troponin 11/09/2015   Gastroenteritis and colitis, viral 08/10/2015   Chest pain 08/09/2015   Dehydration 08/09/2015   Acute kidney injury (HCC) 08/09/2015   Essential hypertension 08/09/2015   Await echocardiogram Place on regular diet with adequate salt and fluid restriction 1200 cc Continue Lasix orally If echo within normal limits and no specific drop in sodium and no high elevations of troponins may be able to discharge a.m. with close outpatient follow-up she has no chest pain at this time we will continue to cycle troponin  And await echocardiogram Pleas KochJai Eden Rho, MD [facility] (P) [phone]

## 2015-11-09 NOTE — Progress Notes (Signed)
ANTIBIOTIC CONSULT NOTE - INITIAL  Pharmacy Consult for Levaquin Indication: UTI   Labs:  Recent Labs  11/09/15 0017  WBC 17.6*  HGB 10.8*  PLT 381  CREATININE 0.85   Estimated Creatinine Clearance: 37.6 mL/min (by C-G formula based on Cr of 0.85).   Assessment/Plan:  81yo female c/o SOB, found to have UTI which is thought to have precipitated pt's condition, to begin IV ABX.  Rec'd Levaquin 500mg  IV in ED; will continue with Levaquin 250mg  IV Q24H and monitor CBC and Cx.  Vernard GamblesVeronda Nachman Sundt, PharmD, BCPS  11/09/2015,3:57 AM

## 2015-11-09 NOTE — Progress Notes (Signed)
Utilization review completed. Anette GuarneriBertha Laylah Riga, RN, BSN.

## 2015-11-09 NOTE — Progress Notes (Signed)
On-call doctor gave orders for labs to be drawn- Magnesium and CMP STAT per the patient having 19 bts of Vtach. Pt was sleeping and did not feel anything- asymptomatic. VSS. Will continue to monitor patient to end of shift.

## 2015-11-09 NOTE — Progress Notes (Signed)
Pt. Arrived from ED via stretcher in alert and stable condition. No s/s of distress noted. Pts. Niece at bedside. Pt. Oriented to room. Call light placed within reach. Pt. Placed on telemetry monitor, CCMD notified of pts. Arrival to floor. RN will continue to monitor pt. For changes in condition. Anna Gonzales, Cheryll DessertKaren Cherrell

## 2015-11-09 NOTE — H&P (Signed)
History and Physical  Patient Name: Anna Gonzales     ZOX:[id number]    DOB: November 03, 1926    DOA: 11/08/2015 Referring physician: Loren Racer, MD PCP: Ginette Otto, MD      Chief Complaint: Shortness of breath  HPI: Anna Gonzales is a 81 y.o. female with a past medical history significant for HTN who presents with several weeks worsening SOB and ankle swelling.  The patient first developed shortness of breath and chest discomfort about 3 months ago. At that time she was admitted for chest pain, rule out with serial troponins, and plan to have outpatient cardiology follow-up, which I believe did not happen.  Since then, the patient has felt episodic dyspnea until about one week ago when dyspnea with exertion returned.  She felt improved with 500 mL normal saline, although her BNP was elevated and she was mildly hypoxic and then ED note suggests that she was to follow-up with her PCP for consideration of Lasix.  Now in the last week the patient has progressive dyspnea of exertion, ankle swelling, increased abdominal girth, suprapubic discomfort, and urinary urge. Today the symptoms were acutely worse and so she presented to the ED.  On arrival by EMS, the patient was noted to have crackles at the bases and was treated with nitroglycerin and BiPAP and felt better. In the ED, the patient had hyponatremia, normal renal function, leukocytosis, elevated BNP and troponin, and urinalysis consistent with UTI. A chest x-ray showed chronic interstitial markings and ECG showed sinus tachycardia.     Review of Systems:  Pt complains of dyspnea, leg swelling, emesis, decreased appetite, dry mouth, abdominal girth, urinary urgency, suprapubic discomfort. Pt denies any fever, chills, cough, sputum production, rigors, orthopnea, paroxysmal nocturnal dyspnea, hematuria, dysuria.  All other systems negative except as just noted or noted in the history of present illness.   Allergies: Sulfa,  nausea.  Latex, hives. Penicillin, itching.   Home medications: 1. Amlodipine 10 mg daily 2. Benazepril 10 mg daily The patient recently stopped HCTZ one week ago because of hyponatremia reportedly.  She hasn't taken aspirin for a long time because of upset stomach.  She hasn't started furosemide.  Past medical history: 1. HTN  Past surgical history: 1. ORIF wrist  Family history:  Mother, stroke, kidney disease, arrhythmia. Father, asthma. Sister, asthma, lung cancer. Brother, heart attack.   Social History:  Patient lives with her husband in a private home, for whom she is the primary caregiver because of Alzheimers'.  She still drives. She does not use a cane or walker. She has a remote brief history of smoking. She does not drink alcohol.        Physical Exam: BP 130/54 mmHg   Pulse 87   Temp(Src) 97.5 F (36.4 C) (Oral)   Resp 24   SpO2 99% General appearance: thin adult female, alert and in moderate distress from illness.   Eyes: Anicteric, conjunctiva pink, lids and lashes normal.     ENT: No nasal deformity, discharge, or epistaxis.  OP tacky without lesions.   Lymph: No cervical, supraclavicular lymphadenopathy. Skin: Warm and dry.  No jaundice.   Cardiac: RRR, nl S1-S2, no murmurs appreciated.  Capillary refill is brisk.  Neck veins prominent.  2+ ankle edema and 1+ pitting to mid shin.  Radial and DP pulses 2+ and symmetric.  No carotid bruits. Respiratory: Mild increased work of breathing.  On nasal cannula.  No rales are appreciated on my exam, diminished at bases. Abdomen: Abdomen  soft without rigidity.  Somewhat distended, no tenseness or ascites.  No TTP or guarding.   MSK: No deformities or effusions. Neuro: Cranial nerves 3-12 intact.  Sensorium intact and responding to questions, attention normal.  Speech is fluent.  Psych: Behavior appropriate.  Affect anxious.  No evidence of aural or visual hallucinations or delusions.       Labs on Admission:  The  metabolic panel shows hyponatremia. Normal renal function. The transaminases and bilirubin are normal. Albumin is low at 2.4 g/dL. The BNP is 232 pg/mL The troponin is elevated at 0.05 ng per mL. The urinalysis shows bacteria and leukocytes which were not present on urinalysis 1 week ago. The complete blood count shows leukocytosis, 17.6 K per UL, chronic normocytic anemia, normal platelet count.   Radiological Exams on Admission: Personally reviewed: Dg Chest Port 1 View 11/09/2015  Chronic interstitial markings, essentially unchanged from previous going all the way back to 2011.    EKG: Independently reviewed. Sinus tachycardia without ST changes.    Assessment/Plan 1. UTI:  This is new.  I think this may be what caused the patient's condition did worsen to the so that she presented to the ER. Her urinalysis was clean 1 week ago and she describes suprapubic pain and urinary urgency. -Levofloxacin 750 mg daily  -Follow urine culture  2. Acute CHF:  This is new.  Unclear type.  The patient does have slowly progressive dyspnea on exertion and leg swelling with elevated BNP and troponin. Her chronic interstitial lung markings on x-ray may serve to obscure pulmonary edema, and although I don't appreciate rales on exam after BiPAP they were noted before, and I do believe the patient appears fluid overloaded at this time. -Furosemide 40 mg IV once -Strict I's and O's -Telemetry -Echocardiogram tomorrow  3. Hyponatremia:  The patient HCTZ was recently stopped. This appears to be a hypervolemic hyponatremia. -Treat CHF and trend BMP -Serum and urine osmolality  4. HTN:  Normotensive at admission.  The patient did not stop HCTZ and benazepril after her last hospitalization, which appears to be her discharge instructions.  I will continue benazepril for now. -Benazepril 10 mg daily  5. Elevated troponin:  Do not suspect ACS.  Suspect this is demand from CHF -Trend  troponin    DVT PPx: Lovenox Diet: Cardiac Consultants: None Code Status: Full Family Communication: Niece present at bedside  Medical decision making: What exists of the patient's previous chart was reviewed in depth and the case was discussed with Dr. Ranae PalmsYelverton. Patient seen 3:13 AM on 11/09/2015.  Disposition Plan:  Admit for teratment of UTI, diuresis and echocardiogram.  Monitor electrolytes and anticipate 2-3 days hospitalization.      Alberteen SamChristopher P Rielynn Trulson [facility] Pager [phone]

## 2015-11-09 NOTE — Progress Notes (Signed)
*  PRELIMINARY RESULTS* Echocardiogram 2D Echocardiogram has been performed.  Jeryl Columbialliott, Manvir Prabhu 11/09/2015, 11:31 AM

## 2015-11-10 DIAGNOSIS — I5031 Acute diastolic (congestive) heart failure: Secondary | ICD-10-CM

## 2015-11-10 LAB — CBC WITH DIFFERENTIAL/PLATELET
BASOS ABS: 0 10*3/uL (ref 0.0–0.1)
BASOS PCT: 0 %
Eosinophils Absolute: 0 10*3/uL (ref 0.0–0.7)
Eosinophils Relative: 0 %
HEMATOCRIT: 31.7 % — AB (ref 36.0–46.0)
HEMOGLOBIN: 10.6 g/dL — AB (ref 12.0–15.0)
LYMPHS PCT: 5 %
Lymphs Abs: 0.8 10*3/uL (ref 0.7–4.0)
MCH: 25.7 pg — ABNORMAL LOW (ref 26.0–34.0)
MCHC: 33.4 g/dL (ref 30.0–36.0)
MCV: 76.9 fL — AB (ref 78.0–100.0)
MONOS PCT: 9 %
Monocytes Absolute: 1.5 10*3/uL — ABNORMAL HIGH (ref 0.1–1.0)
NEUTROS ABS: 14.3 10*3/uL — AB (ref 1.7–7.7)
NEUTROS PCT: 86 %
Platelets: 360 10*3/uL (ref 150–400)
RBC: 4.12 MIL/uL (ref 3.87–5.11)
RDW: 12.9 % (ref 11.5–15.5)
WBC: 16.6 10*3/uL — ABNORMAL HIGH (ref 4.0–10.5)

## 2015-11-10 LAB — TROPONIN I
TROPONIN I: 0.08 ng/mL — AB (ref <0.031)
Troponin I: 0.14 ng/mL — ABNORMAL HIGH (ref <0.031)
Troponin I: 0.05 ng/mL — ABNORMAL HIGH (ref <0.031)
Troponin I: 0.06 ng/mL — ABNORMAL HIGH (ref <0.031)

## 2015-11-10 MED ORDER — MAGNESIUM OXIDE 400 (241.3 MG) MG PO TABS
400.0000 mg | ORAL_TABLET | Freq: Two times a day (BID) | ORAL | Status: DC
  Administered 2015-11-10 – 2015-11-11 (×3): 400 mg via ORAL
  Filled 2015-11-10 (×3): qty 1

## 2015-11-10 MED ORDER — MAGNESIUM SULFATE 2 GM/50ML IV SOLN
2.0000 g | Freq: Once | INTRAVENOUS | Status: AC
  Administered 2015-11-10: 2 g via INTRAVENOUS
  Filled 2015-11-10: qty 50

## 2015-11-10 MED ORDER — LISINOPRIL 2.5 MG PO TABS
2.5000 mg | ORAL_TABLET | Freq: Every day | ORAL | Status: DC
  Administered 2015-11-10 – 2015-11-11 (×2): 2.5 mg via ORAL
  Filled 2015-11-10 (×2): qty 1

## 2015-11-10 NOTE — Consult Note (Addendum)
Admit date: 11/08/2015 Referring Physician  Dr. Mahala Menghini Primary Physician  Dr. Pete Glatter Primary Cardiologist  Dr. Elease Hashimoto Reason for Consultation  CHF  HPI: This is an 81yo WF with recent hospital admission for CP in August.  Prior to that she had not had any history of heart disease.  She has a history of HTN. Her CP at that time occurred in the setting of acute gastroenteritis and dehydration.  She ruled out for MI at that time and was instructed to followup in our office.  Over the past few weeks she has been having SOB and ankle edema. She says that she had SOB for the first time about 3 months ago at the time of her CP.  She never followed up with Cardiology outpt.  She has had episodic DOE since then.  She was seen in the ER no 11/14 with mildly elevated BNP and was instructed to followup with her PCP to consider Lasix.  Over the past week she has had progressive DOE with ankle edema and increased abdominal girth.  She presented to the ER and was noted to have bibasilar crackles.  She was placed on BiPAP and NTG.  She was found to be hyponatremic with elevated BNP and troponin and chest xray showed chronic interstitial markings.  EKG showed sinus tachycardia.  She was also noted to have a run of WCT on tele.  Cardiology is now asked to evaluate.  Peak troponin was 0.14.  She has been getting IV Lasix.  2D echo showed normal LVF with mild AR and mild pulmonary HTN.  ? Of rheumatic MV.       PMH:   Past Medical History  Diagnosis Date   Hypertension      PSH:   Past Surgical History  Procedure Laterality Date   Orif distal radius fracture      Allergies:  Sulfa antibiotics; Latex; and Penicillins Prior to Admit Meds:   Prescriptions prior to admission  Medication Sig Dispense Refill Last Dose   amLODipine (NORVASC) 10 MG tablet Take 10 mg by mouth daily.   11/08/2015 at Unknown time   aspirin 325 MG tablet Take 325 mg by mouth daily.   11/08/2015 at Unknown time   CALCIUM PO  Take 1 tablet by mouth daily.   11/08/2015 at Unknown time   cholecalciferol (VITAMIN D) 1000 UNITS tablet Take 2,000 Units by mouth daily.   11/08/2015 at Unknown time   Cyanocobalamin (VITAMIN B 12 PO) Take 1 tablet by mouth daily.   11/08/2015 at Unknown time   furosemide (LASIX) 20 MG tablet Take 1 tablet (20 mg total) by mouth daily. 15 tablet 0 11/08/2015 at Unknown time   Multiple Vitamins-Minerals (EYE VITAMINS) CAPS Take 1 capsule by mouth daily.   11/08/2015 at Unknown time   Polyethyl Glycol-Propyl Glycol (SYSTANE OP) Apply 1 drop to eye daily. For both eyes   11/08/2015 at Unknown time   Fam HX:    Family History  Problem Relation Age of Onset   Stroke Mother    Kidney disease Mother    Arrhythmia Mother    Asthma Father    Asthma Sister    Lung cancer Sister    Heart attack Brother    Social HX:    Social History   Social History   Marital Status: Married    Spouse Name: N/A   Number of Children: N/A   Years of Education: N/A   Occupational History   Not on file.  Social History Main Topics   Smoking status: Never Smoker    Smokeless tobacco: Not on file   Alcohol Use: No   Drug Use: No   Sexual Activity: Not on file   Other Topics Concern   Not on file   Social History Narrative     ROS:  All 11 ROS were addressed and are negative except what is stated in the HPI  Physical Exam: Blood pressure 118/39, pulse 87, temperature 99.3 F (37.4 C), temperature source Oral, resp. rate 16, height  (1.549 m), weight 48.263 kg (106 lb 6.4 oz), SpO2 90 %.    General: Well developed, well nourished, in no acute distress Head: Eyes PERRLA, No xanthomas.   Normal cephalic and atramatic  Lungs:   Clear bilaterally to auscultation and percussion. Heart:   HRRR S1 S2 Pulses are 2+ & equal.            No carotid bruit. No JVD.  No abdominal bruits. No femoral bruits. Abdomen: Bowel sounds are positive, abdomen soft and non-tender without  masses  Extremities:   No clubbing, cyanosis or edema.  DP +1 Neuro: Alert and oriented X 3. Psych:  Good affect, responds appropriately    Labs:   Lab Results  Component Value Date   WBC 16.6* 11/10/2015   HGB 10.6* 11/10/2015   HCT 31.7* 11/10/2015   MCV 76.9* 11/10/2015   PLT 360 11/10/2015    Recent Labs Lab 11/09/15 2232  NA 128*  K 3.5  CL 90*  CO2 29  BUN 14  CREATININE 0.94  CALCIUM 8.7*  PROT 5.3*  BILITOT 0.4  ALKPHOS 91  ALT 9*  AST 24  GLUCOSE 121*   No results found for: PTT No results found for: INR, PROTIME Lab Results  Component Value Date   TROPONINI 0.14* 11/10/2015    No results found for: CHOL No results found for: HDL No results found for: LDLCALC No results found for: TRIG No results found for: CHOLHDL No results found for: LDLDIRECT    Radiology:  Dg Chest Port 1 View  11/09/2015  CLINICAL DATA:  Shortness of breath and nausea tonight. EXAM: PORTABLE CHEST 1 VIEW COMPARISON:  10/31/2015 FINDINGS: Mild cardiac enlargement without significant vascular congestion. Emphysematous changes with chronic interstitial fibrosis in the lungs. Increased density in the left lung base may be due to shallow inspiration but infiltration or atelectasis in the left base is not excluded. No pneumothorax. Calcified and tortuous aorta. IMPRESSION: Cardiac enlargement. Emphysematous changes with interstitial fibrosis in the lungs. Possible superimposed infiltration or atelectasis in the left base. Electronically Signed   By: Burman Nieves M.D.   On: 11/09/2015 00:29    EKG:  NSR with nonspecific ST abnormality  ASSESSMENT/PLAN: 1.  Acute diastolic CHF - not sure what tipped her over into CHF.  I suspect a large part of her volume overload is due to marked hypoalbuminemia.  Sodium is slowly improving.   Follow strict I&O's.  Her LE edema has resolved.  Lungs are essentially clear.  Agree with PO lasix.   2.  Wide complex tachycardia for 10 beats that looks  like ventricular tachycardia.  Potassium 3.5.  Mg is low.  Will replete.  Keep K > 4.  Normal LVF on echo. Given her CP in august and now CHF with ventricular arrhythmias, will make NPO for stress test tomorrow.   3.  HTN - controlled 4.  Hypoalbuminemia - per IM 5.  Hypomagnesemia - repleted 6.  Elevated troponin with flat trend most likely related to CHF. I do not think this is an acute coronary syndrome. She has not had any chest pain since August. Will make NPO after MN.  Continue to cycle enzymes and decide in am cath vs. Nuclear stress test pending trop trend.  Quintella ReichertURNER,Jermichael Belmares R, MD  11/10/2015  9:20 AM

## 2015-11-10 NOTE — Progress Notes (Signed)
Anna Gonzales:[id number] DOB: 05/25/1926 DOA: 11/08/2015 PCP: Ginette Otto, MD  Brief narrative: 81 y/o ? htn Prior colonoscopy 02/2004 Admitted last 07/2015 [facility] with AKI on that admit-Had CP and was supposed to follow up as OP with Cardiology  States that in the past month has come to the emergency room X3 although I only see one emergency room visit dated on the 14th for debility She was given Lasix on discharge from the emergency room at that time Asthma that she came to the emergency room she stopped when she was catheterized and started opacity in Currently in no distress Chest pain no nausea no vomiting Tolerating diet but hasn't used and probably in the past 2-3 days and has been drinking mainly fluids  Past medical history-As per Problem list Chart reviewed as below-   Consultants:  Cardiology  Procedures:  none  Antibiotics:   none   Subjective  Alert pleasant No sob No cp Not hungry and had large breakfast No stool since monday   Objective    Interim History:   Telemetry: Multiple PVC's-20 bts Vtach overnight on tele   Objective: Filed Vitals:   11/09/15 2130 11/10/15 0038 11/10/15 0425 11/10/15 0900  BP: 122/60 126/58 118/39 121/59  Pulse: 85 80 87 88  Temp: 97.8 F (36.6 C) 97.3 F (36.3 C) 99.3 F (37.4 C) 99.1 F (37.3 C)  TempSrc: Oral Oral Oral Oral  Resp: Height:      Weight:   48.263 kg (106 lb 6.4 oz)   SpO2: 96% 98% 90% 100%    Intake/Output Summary (Last 24 hours) at 11/10/15 1307 Last data filed at 11/10/15 1013  Gross per 24 hour  Intake    793 ml  Output   1800 ml  Net  -1007 ml    Exam:  General: eomi frail bitemporalis wasting Cardiovascular: s1 s 2no m/r/g Respiratory: clear no added sound Abdomen:  Soft nt nd no rebound Skin intact with no edema Neuro intact moving all 4 limbs ='lly  Data Reviewed: Basic Metabolic Panel:  Recent Labs Lab 11/09/15 0017 11/09/15 0438  11/09/15 2232  NA 124* 126* 128*  K 3.8 3.9 3.5  CL 90* 91* 90*  CO2 GLUCOSE 134* 117* 121*  BUN CREATININE 0.85 0.86 0.94  CALCIUM 9.1 9.1 8.7*  MG  --  1.8 1.6*   Liver Function Tests:  Recent Labs Lab 11/09/15 0017 11/09/15 2232  AST 18 24  ALT 8* 9*  ALKPHOS 80 91  BILITOT 0.8 0.4  PROT 5.4* 5.3*  ALBUMIN 2.4* 2.3*   No results for input(s): LIPASE, AMYLASE in the last 168 hours. No results for input(s): AMMONIA in the last 168 hours. CBC:  Recent Labs Lab 11/09/15 0017 11/09/15 0438 11/10/15 0325  WBC 17.6* 15.7* 16.6*  NEUTROABS 14.8*  --  14.3*  HGB 10.8* 10.4* 10.6*  HCT 32.7* 31.5* 31.7*  MCV 76.6* 76.8* 76.9*  PLT 381 380 360   Cardiac Enzymes:  Recent Labs Lab 11/09/15 1212 11/09/15 1539 11/09/15 2201 11/10/15 0325 11/10/15 1008  TROPONINI 0.07* 0.09* 0.11* 0.14* 0.05*   BNP: Invalid input(s): POCBNP CBG: No results for input(s): GLUCAP in the last 168 hours.  Recent Results (from the past 240 hour(s))  Culture, Urine     Status: None (Preliminary result)   Collection Time: 11/09/15  1:42 AM  Result Value Ref Range Status   Specimen Description URINE,  RANDOM  Final   Special Requests CX ADDED AT 0315 ON [id number]  Final   Culture >=100,000 COLONIES/mL ESCHERICHIA COLI  Final   Report Status PENDING  Incomplete     Studies:              All Imaging reviewed and is as per above notation   Scheduled Meds:  enoxaparin (LOVENOX) injection  30 mg Subcutaneous Q24H   furosemide  40 mg Oral Daily   levofloxacin (LEVAQUIN) IV  250 mg Intravenous Q24H   sodium chloride  3 mL Intravenous Q12H   Continuous Infusions:    Assessment/Plan:  1. Acute diastolic HF-possibly rheumatic? echo 11/24-continue to diurese with lasix 40 po daily. -2.12 liters so far 2. Elevated troponin + Episodic Vtach-Will need to r/o ACS-Cardiology input appreciated.  For Stress test am 11/11/15 3. Hypervolemic hyponatremia-Continue Lasix.   Fluid restrict 1200 cc 4. E.coli pyelonephritis-contionue IV levaquin per pharmacy-await final sensitivities 5. HTn-moderately controlled without meds.  Would start BB after decision as per Cath or stress in am.  Will go ahead and start low dose ACE lisinopril 2.5 now  Code Status: full Family Communication:  No family + Disposition Plan:  Inpatient pedning resolution in 48-72 hrs   Pleas KochJai Noam Karaffa, MD  [facility] Pager [phone] 11/10/2015, 1:07 PM    LOS: 1 day

## 2015-11-11 ENCOUNTER — Inpatient Hospital Stay (HOSPITAL_COMMUNITY): Payer: Medicare Other

## 2015-11-11 DIAGNOSIS — I5031 Acute diastolic (congestive) heart failure: Secondary | ICD-10-CM | POA: Insufficient documentation

## 2015-11-11 DIAGNOSIS — I472 Ventricular tachycardia: Secondary | ICD-10-CM

## 2015-11-11 LAB — NM MYOCAR MULTI W/SPECT W/WALL MOTION / EF
CHL CUP MPHR: 133 {beats}/min
CSEPHR: 75 %
CSEPPHR: 100 {beats}/min
Estimated workload: 1 METS
Exercise duration (min): 0 min
Exercise duration (sec): 0 s
Rest HR: 86 {beats}/min

## 2015-11-11 LAB — CBC
HEMATOCRIT: 32.6 % — AB (ref 36.0–46.0)
Hemoglobin: 10.5 g/dL — ABNORMAL LOW (ref 12.0–15.0)
MCH: 25.1 pg — ABNORMAL LOW (ref 26.0–34.0)
MCHC: 32.2 g/dL (ref 30.0–36.0)
MCV: 77.8 fL — AB (ref 78.0–100.0)
Platelets: 399 10*3/uL (ref 150–400)
RBC: 4.19 MIL/uL (ref 3.87–5.11)
RDW: 13.3 % (ref 11.5–15.5)
WBC: 16.8 10*3/uL — ABNORMAL HIGH (ref 4.0–10.5)

## 2015-11-11 LAB — BASIC METABOLIC PANEL
Anion gap: 7 (ref 5–15)
BUN: 15 mg/dL (ref 6–20)
CHLORIDE: 89 mmol/L — AB (ref 101–111)
CO2: 34 mmol/L — ABNORMAL HIGH (ref 22–32)
Calcium: 9 mg/dL (ref 8.9–10.3)
Creatinine, Ser: 0.92 mg/dL (ref 0.44–1.00)
GFR calc Af Amer: >60 mL/min (ref >60)
GFR calc non Af Amer: 54 mL/min — ABNORMAL LOW (ref >60)
GLUCOSE: 91 mg/dL (ref 65–99)
POTASSIUM: 3.2 mmol/L — AB (ref 3.5–5.1)
Sodium: 130 mmol/L — ABNORMAL LOW (ref 135–145)

## 2015-11-11 LAB — URINE CULTURE

## 2015-11-11 LAB — MAGNESIUM: Magnesium: 2 mg/dL (ref 1.7–2.4)

## 2015-11-11 MED ORDER — REGADENOSON 0.4 MG/5ML IV SOLN
INTRAVENOUS | Status: AC
  Administered 2015-11-11: 12:00:00
  Filled 2015-11-11: qty 5

## 2015-11-11 MED ORDER — FUROSEMIDE 40 MG PO TABS: 40.0000 mg | ORAL_TABLET | Freq: Every day | ORAL | Status: DC

## 2015-11-11 MED ORDER — REGADENOSON 0.4 MG/5ML IV SOLN
0.4000 mg | Freq: Once | INTRAVENOUS | Status: AC
  Administered 2015-11-11: 0.4 mg via INTRAVENOUS
  Filled 2015-11-11: qty 5

## 2015-11-11 MED ORDER — CEFUROXIME AXETIL 500 MG PO TABS
250.0000 mg | ORAL_TABLET | Freq: Two times a day (BID) | ORAL | Status: DC
  Administered 2015-11-11: 250 mg via ORAL
  Filled 2015-11-11: qty 1

## 2015-11-11 MED ORDER — TECHNETIUM TC 99M SESTAMIBI GENERIC - CARDIOLITE
30.0000 | Freq: Once | INTRAVENOUS | Status: AC | PRN
  Administered 2015-11-11: 30 via INTRAVENOUS

## 2015-11-11 MED ORDER — POTASSIUM CHLORIDE CRYS ER 20 MEQ PO TBCR
40.0000 meq | EXTENDED_RELEASE_TABLET | ORAL | Status: AC
  Administered 2015-11-11 (×3): 40 meq via ORAL
  Filled 2015-11-11 (×3): qty 2

## 2015-11-11 MED ORDER — TECHNETIUM TC 99M SESTAMIBI GENERIC - CARDIOLITE
10.0000 | Freq: Once | INTRAVENOUS | Status: AC | PRN
Start: 2015-11-11 — End: 2015-11-11
  Administered 2015-11-11: 10 via INTRAVENOUS

## 2015-11-11 MED ORDER — LISINOPRIL 2.5 MG PO TABS: 2.5000 mg | ORAL_TABLET | Freq: Every day | ORAL | Status: DC

## 2015-11-11 MED ORDER — CEFUROXIME AXETIL 250 MG PO TABS: 250.0000 mg | ORAL_TABLET | Freq: Two times a day (BID) | ORAL | Status: DC

## 2015-11-11 NOTE — Progress Notes (Signed)
Pt has orders to be discharged. Discharge instructions given and pt has no additional questions at this time. Medication regimen reviewed and pt educated. Pt verbalized understanding and has no additional questions. Telemetry box removed. IV removed and site in good condition. Pt stable and waiting for transportation.   Jilda PandaBethany Lott Seelbach RN

## 2015-11-11 NOTE — Progress Notes (Signed)
1 day lexiscan myoview completed without significant complication, [facility]  Signed, Azalee CourseHao Candise Crabtree PA Pager: [phone]

## 2015-11-11 NOTE — Progress Notes (Signed)
Patient Name: Anna Gonzales Date of Encounter: 11/11/2015  Primary Cardiologist Dr. Elease HashimotoNahser   Principal Problem:   Acute CHF (congestive heart failure) (HCC) Active Problems:   Essential hypertension   UTI (lower urinary tract infection)   Elevated troponin    SUBJECTIVE  Denies any chest pain recently. SOB improved. LE edema also improved. Blamed taking off HCTZ for swelling.   CURRENT MEDS  cefUROXime  250 mg Oral BID WC   enoxaparin (LOVENOX) injection  30 mg Subcutaneous Q24H   furosemide  40 mg Oral Daily   lisinopril  2.5 mg Oral Daily   magnesium oxide  400 mg Oral BID   sodium chloride  3 mL Intravenous Q12H    OBJECTIVE  Filed Vitals:   11/10/15 0900 11/10/15 2054 11/11/15 0021 11/11/15 0508  BP: 121/59 133/56 142/62 153/58  Pulse: 88 95 97 94  Temp: 99.1 F (37.3 C) 98.9 F (37.2 C) 98.8 F (37.1 C) 98 F (36.7 C)  TempSrc: Oral Oral Oral Oral  Resp: 17 18 18 18   Height:      Weight:    106 lb 8 oz (48.308 kg)  SpO2: 100% 94% 93% 93%    Intake/Output Summary (Last 24 hours) at 11/11/15 0757 Last data filed at 11/11/15 0600  Gross per 24 hour  Intake    740 ml  Output   1725 ml  Net   -985 ml   Filed Weights   11/09/15 0358 11/10/15 0425 11/11/15 0508  Weight: 110 lb 9.6 oz (50.168 kg) 106 lb 6.4 oz (48.263 kg) 106 lb 8 oz (48.308 kg)    PHYSICAL EXAM  General: Pleasant, NAD. Neuro: Alert and oriented X 3. Moves all extremities spontaneously. Psych: Normal affect. HEENT:  Normal  Neck: Supple without bruits or JVD. Lungs:  Resp regular and unlabored. Diminished breath sound in L base Heart: RRR no s3, s4, or murmurs. Abdomen: Soft, non-tender, non-distended, BS + x 4.  Extremities: No clubbing, cyanosis or edema. DP/PT/Radials 2+ and equal bilaterally.  Accessory Clinical Findings  CBC  Recent Labs  11/09/15 0017  11/10/15 0325 11/11/15 0256  WBC 17.6*  < > 16.6* 16.8*  NEUTROABS 14.8*  --  14.3*  --   HGB 10.8*  < >  10.6* 10.5*  HCT 32.7*  < > 31.7* 32.6*  MCV 76.6*  < > 76.9* 77.8*  PLT 381  < > 360 399  < > = values in this interval not displayed. Basic Metabolic Panel  Recent Labs  11/09/15 2232 11/11/15 0256  NA 128* 130*  K 3.5 3.2*  CL 90* 89*  CO2 29 34*  GLUCOSE 121* 91  BUN 14 15  CREATININE 0.94 0.92  CALCIUM 8.7* 9.0  MG 1.6* 2.0   Liver Function Tests  Recent Labs  11/09/15 0017 11/09/15 2232  AST 18 24  ALT 8* 9*  ALKPHOS 80 91  BILITOT 0.8 0.4  PROT 5.4* 5.3*  ALBUMIN 2.4* 2.3*   Cardiac Enzymes  Recent Labs  11/10/15 1008 11/10/15 1540 11/10/15 2155  TROPONINI 0.05* 0.06* 0.08*    TELE NSR with 1 episode of atrial tach last night. No further wide complex tachycardia    ECG  No new EKG  Echocardiogram 11/09/2015  LV EF: 55% -  60%  ------------------------------------------------------------------- Indications:   CHF - 428.0.  ------------------------------------------------------------------- History:  PMH: Elevated Troponin. Angina pectoris. Congestive heart failure. Risk factors: Hypertension.  ------------------------------------------------------------------- Study Conclusions  - Left ventricle: The cavity size was  normal. Wall thickness was normal. Systolic function was normal. The estimated ejection fraction was in the range of 55% to 60%. - Aortic valve: There was mild regurgitation. - Mitral valve: Calcified and thickened leaflets ? rheumatic There was mild regurgitation. - Atrial septum: No defect or patent foramen ovale was identified. - Pulmonary arteries: PA peak pressure: 42 mm Hg (S).     Radiology/Studies  Dg Chest 2 View  10/31/2015  CLINICAL DATA:  Shortness of breath and weakness, 4 days duration. EXAM: CHEST  2 VIEW COMPARISON:  08/09/2015 and previous FINDINGS: Heart size is normal. There is atherosclerosis of the aorta. There is chronic blunting of the posterior costophrenic angles. The lungs show  slightly increased chronic interstitial markings but there is no evidence of focal active infiltrate, mass or collapse. There are old rib fractures on the left. No acute bone finding. IMPRESSION: Atherosclerosis of the aorta. Mild chronic interstitial lung markings. No focal or active process otherwise. Chronic blunting of the posterior costophrenic angles presumed secondary to scarring. Electronically Signed   By: Paulina Fusi M.D.   On: 10/31/2015 19:36   Dg Chest Port 1 View  11/09/2015  CLINICAL DATA:  Shortness of breath and nausea tonight. EXAM: PORTABLE CHEST 1 VIEW COMPARISON:  10/31/2015 FINDINGS: Mild cardiac enlargement without significant vascular congestion. Emphysematous changes with chronic interstitial fibrosis in the lungs. Increased density in the left lung base may be due to shallow inspiration but infiltration or atelectasis in the left base is not excluded. No pneumothorax. Calcified and tortuous aorta. IMPRESSION: Cardiac enlargement. Emphysematous changes with interstitial fibrosis in the lungs. Possible superimposed infiltration or atelectasis in the left base. Electronically Signed   By: Burman Nieves M.D.   On: 11/09/2015 00:29    ASSESSMENT AND PLAN  81 yo WF with no past cardiac history who was last seen in Aug 2016 for CP in the setting of acute gastroenteritis and dehydration came in with increasing SOB, LE edema and intermittent run WCT. Echo shows normal EF  1. Acute diastolic HF in the setting of hypoalbuminemia  - her HCTZ stopped in Aug given dehydration, after discharge she started noticing increasing LE edema and SOB  - s/p IV diuresis, I/O -2.8L, weight down from 112 to 106 lbs  - currently appears to be euvolemic, still has diminished breath sound esp in L basilar region. If has recurrent dehydration in the future, can do PRN lasix.    2. Wide complex tachycardia  - K 3.2, replete. KCl x 3 every 2 hrs.  3. HTN  4. Hypoalbuminemia  5.  Hypomagnesemia  6. Elevated trop likely demand ischemia, flat trend  7. Leukocytosis: possibly related to #8  8. Possible UTI: many bacteremia but negative nitrite. Culture shows >100,000 E coli  Signed, Amedeo Plenty Pager: [phone]  Patient seen, examined. Available data reviewed. Agree with findings, assessment, and plan as outlined by Azalee Course, PA-C. Exam reveals an alert, oriented, elderly woman in no distress. Lung fields are clear. Heart is regular rate and rhythm. There is trace pretibial edema present. I have reviewed the patient's radiographic data and lab results. Her nuclear scan shows no ischemia and it is interpreted as a low risk study. There is some gut attenuation artifact noted, but LVEF is normal. She does not require further ischemic evaluation. She has no chest pain or pressure with exertion. It appears volume overload occurred after discontinuing hydrochlorothiazide. She has had problems with hyponatremia. Will try furosemide 40 mg daily as  outlined and she should have close follow-up with a metabolic panel in the near future. Otherwise no further cardiac evaluation indicated at this time.  Tonny Bollman, M.D. 11/11/2015 2:45 PM

## 2015-11-11 NOTE — Discharge Summary (Signed)
Physician Discharge Summary  Anna Gonzales ZOX:[id number] DOB: 1926-05-07 DOA: 11/08/2015  PCP: Ginette Otto, MD  Admit date: 11/08/2015 Discharge date: 11/11/2015  Time spent: 35 minutes  Recommendations for Outpatient Follow-up:  1. Lasix  20-->40 ibn d/c 2. Added Lisinopril for HTn control 3. Will need to complete Ceftin 250 bid on 11/14/15 for pyelo  Discharge Diagnoses:  Principal Problem:   Acute CHF (congestive heart failure) (HCC) Active Problems:   Essential hypertension   UTI (lower urinary tract infection)   Elevated troponin   Discharge Condition: stable  Diet recommendation:  Heart healthy  Filed Weights   11/09/15 0358 11/10/15 0425 11/11/15 0508  Weight: 50.168 kg (110 lb 9.6 oz) 48.263 kg (106 lb 6.4 oz) 48.308 kg (106 lb 8 oz)   81 y/o ? htn Prior colonoscopy 02/2004 Admitted last 07/2015 Viral Ge with AKI on that admit-Had CP and was supposed to follow up as OP with Cardiology  States that in the past month has come to the emergency room X3 although I only see one emergency room visit dated on the 14th for debility She was given Lasix on discharge from the emergency room at that time she was catheterized in ED and started on Abx  Admitted for presumed CHF and Pyelo   History of present illness: Assessment/Plan:    1. Acute diastolic HF-possibly rheumatic? echo 11/24-continue to diurese with lasix 40 po daily. -3.012 liters so far from admit.   2. Elevated troponin + Episodic Vtach-Will need to r/o ACS-Cardiology consulted and patient had neg Lexiscan stress test.  No recurrences of VT 3. Hypervolemic hyponatremia-Continue Lasix.  Fluid restrict 1200 cc-Sodium clombed to 130 from 126 on d/c.  Patient to have labs as OP 4. E.coli pyelonephritis-contionue IV levaquin per pharmacy-transitioned on d/c to Ceftin 250 bid, complete on 11/14/15 5. HTn-moderately controlled without meds.  d start low dose ACE lisinopril 2.5  now   Procedures: Lexiscan neg 11/25   Consultations:  Cardiology  Discharge Exam: Ceasar Mons Vitals:   11/11/15 1129 11/11/15 1259  BP: 150/51 139/57  Pulse:  99  Temp:  98.1 F (36.7 C)  Resp:  18    General: alert pleasant oriented in nad Cardiovascular: s1 s2 no m/r/g Respiratory: clear no added sound, no LE edema  Discharge Instructions   Discharge Instructions    Diet - low sodium heart healthy    Complete by:  As directed      Discharge instructions    Complete by:  As directed   You had a small urinary infeciton in addition to heart failure Please note that your antibiotics should be finished-you should take them 2 x a day until all gone We have increased your lasix dose to 40 daily. Please start a blood pressure medicine called Lisinopril You will need lab work at your regular Md office soon     Increase activity slowly    Complete by:  As directed           Current Discharge Medication List    START taking these medications   Details  cefUROXime (CEFTIN) 250 MG tablet Take 1 tablet (250 mg total) by mouth 2 (two) times daily with a meal. Qty: 6 tablet, Refills: 0    lisinopril (PRINIVIL,ZESTRIL) 2.5 MG tablet Take 1 tablet (2.5 mg total) by mouth daily. Qty: 30 tablet, Refills: 0      CONTINUE these medications which have CHANGED   Details  furosemide (LASIX) 40 MG tablet Take 1 tablet (40  mg total) by mouth daily. Qty: 30 tablet, Refills: 0      CONTINUE these medications which have NOT CHANGED   Details  amLODipine (NORVASC) 10 MG tablet Take 10 mg by mouth daily.    aspirin 325 MG tablet Take 325 mg by mouth daily.    CALCIUM PO Take 1 tablet by mouth daily.    cholecalciferol (VITAMIN D) 1000 UNITS tablet Take 2,000 Units by mouth daily.    Cyanocobalamin (VITAMIN B 12 PO) Take 1 tablet by mouth daily.    Multiple Vitamins-Minerals (EYE VITAMINS) CAPS Take 1 capsule by mouth daily.    Polyethyl Glycol-Propyl Glycol (SYSTANE OP) Apply 1  drop to eye daily. For both eyes       Allergies  Allergen Reactions   Sulfa Antibiotics Nausea And Vomiting   Latex Hives and Rash   Penicillins Itching and Rash    Has patient had a PCN reaction causing immediate rash, facial/tongue/throat swelling, SOB or lightheadedness with hypotension: {Yes Has patient had a PCN reaction causing severe rash involving mucus membranes or skin necrosis:NO Has patient had a PCN reaction that equired hospitalizationNO Has patient had a PCN reaction occurring within the last 10 years: NO If all of the above answers are "NO", then may proceed with Cephalosporin use.      The results of significant diagnostics from this hospitalization (including imaging, microbiology, ancillary and laboratory) are listed below for reference.    Significant Diagnostic Studies: Dg Chest 2 View  10/31/2015  CLINICAL DATA:  Shortness of breath and weakness, 4 days duration. EXAM: CHEST  2 VIEW COMPARISON:  08/09/2015 and previous FINDINGS: Heart size is normal. There is atherosclerosis of the aorta. There is chronic blunting of the posterior costophrenic angles. The lungs show slightly increased chronic interstitial markings but there is no evidence of focal active infiltrate, mass or collapse. There are old rib fractures on the left. No acute bone finding. IMPRESSION: Atherosclerosis of the aorta. Mild chronic interstitial lung markings. No focal or active process otherwise. Chronic blunting of the posterior costophrenic angles presumed secondary to scarring. Electronically Signed   By: Paulina Fusi M.D.   On: 10/31/2015 19:36   Nm Myocar Multi W/spect W/wall Motion / Ef  11/11/2015  CLINICAL DATA:  81 year old female with wide-complex tachycardia. EXAM: MYOCARDIAL IMAGING WITH SPECT (REST AND PHARMACOLOGIC-STRESS) GATED LEFT VENTRICULAR WALL MOTION STUDY LEFT VENTRICULAR EJECTION FRACTION TECHNIQUE: Standard myocardial SPECT imaging was performed after resting intravenous  injection of 10 mCi Tc-48m sestamibi. Subsequently, intravenous infusion of Lexiscan was performed under the supervision of the Cardiology staff. At peak effect of the drug, 30 mCi Tc-50m sestamibi was injected intravenously and standard myocardial SPECT imaging was performed. Quantitative gated imaging was also performed to evaluate left ventricular wall motion, and estimate left ventricular ejection fraction. COMPARISON:  Chest radiograph 11/09/2015 FINDINGS: Perfusion: There is no evidence to suggest reversible ischemia. However, there are decreased counts along the inferior wall on both the rest and stress images and this is probably related to increased uptake in the upper abdomen. As a result, limited evaluation of the inferior wall. Wall Motion: Normal left ventricular wall motion. No left ventricular dilation. Left Ventricular Ejection Fraction: 70 % End diastolic volume 51 ml End systolic volume 15 ml IMPRESSION: 1. No evidence for reversible ischemia but limited evaluation of the inferior wall as described. 2. Normal left ventricular wall motion. 3. Left ventricular ejection fraction is 70%. 4. Low-risk stress test findings*. *2012 Appropriate Use Criteria for Coronary  Revascularization Focused Update: J Am Coll Cardiol. 2012;59(9):857-881. http://content.dementiazones.comonlinejacc.org/article.aspx?articleid=1201161 Electronically Signed   By: Richarda OverlieAdam  Henn M.D.   On: 11/11/2015 13:50   Dg Chest Port 1 View  11/09/2015  CLINICAL DATA:  Shortness of breath and nausea tonight. EXAM: PORTABLE CHEST 1 VIEW COMPARISON:  10/31/2015 FINDINGS: Mild cardiac enlargement without significant vascular congestion. Emphysematous changes with chronic interstitial fibrosis in the lungs. Increased density in the left lung base may be due to shallow inspiration but infiltration or atelectasis in the left base is not excluded. No pneumothorax. Calcified and tortuous aorta. IMPRESSION: Cardiac enlargement. Emphysematous changes with  interstitial fibrosis in the lungs. Possible superimposed infiltration or atelectasis in the left base. Electronically Signed   By: Burman NievesWilliam  Stevens M.D.   On: 11/09/2015 00:29    Microbiology: Recent Results (from the past 240 hour(s))  Culture, Urine     Status: None   Collection Time: 11/09/15  1:42 AM  Result Value Ref Range Status   Specimen Description URINE, RANDOM  Final   Special Requests CX ADDED AT 0315 ON 112316  Final   Culture >=100,000 COLONIES/mL ESCHERICHIA COLI  Final   Report Status 11/11/2015 FINAL  Final   Organism ID, Bacteria ESCHERICHIA COLI  Final      Susceptibility   Escherichia coli - MIC*    AMPICILLIN <=2 SENSITIVE Sensitive     CEFAZOLIN <=4 SENSITIVE Sensitive     CEFTRIAXONE <=1 SENSITIVE Sensitive     CIPROFLOXACIN <=0.25 SENSITIVE Sensitive     GENTAMICIN <=1 SENSITIVE Sensitive     IMIPENEM <=0.25 SENSITIVE Sensitive     NITROFURANTOIN <=16 SENSITIVE Sensitive     TRIMETH/SULFA <=20 SENSITIVE Sensitive     AMPICILLIN/SULBACTAM <=2 SENSITIVE Sensitive     PIP/TAZO <=4 SENSITIVE Sensitive     * >=100,000 COLONIES/mL ESCHERICHIA COLI     Labs: Basic Metabolic Panel:  Recent Labs Lab 11/09/15 0017 11/09/15 0438 11/09/15 2232 11/11/15 0256  NA 124* 126* 128* 130*  K 3.8 3.9 3.5 3.2*  CL 90* 91* 90* 89*  CO2 27 28 29  34*  GLUCOSE 134* 117* 121* 91  BUN 13 13 14 15   CREATININE 0.85 0.86 0.94 0.92  CALCIUM 9.1 9.1 8.7* 9.0  MG  --  1.8 1.6* 2.0   Liver Function Tests:  Recent Labs Lab 11/09/15 0017 11/09/15 2232  AST 18 24  ALT 8* 9*  ALKPHOS 80 91  BILITOT 0.8 0.4  PROT 5.4* 5.3*  ALBUMIN 2.4* 2.3*   No results for input(s): LIPASE, AMYLASE in the last 168 hours. No results for input(s): AMMONIA in the last 168 hours. CBC:  Recent Labs Lab 11/09/15 0017 11/09/15 0438 11/10/15 0325 11/11/15 0256  WBC 17.6* 15.7* 16.6* 16.8*  NEUTROABS 14.8*  --  14.3*  --   HGB 10.8* 10.4* 10.6* 10.5*  HCT 32.7* 31.5* 31.7* 32.6*   MCV 76.6* 76.8* 76.9* 77.8*  PLT 381 380 360 399   Cardiac Enzymes:  Recent Labs Lab 11/09/15 2201 11/10/15 0325 11/10/15 1008 11/10/15 1540 11/10/15 2155  TROPONINI 0.11* 0.14* 0.05* 0.06* 0.08*   BNP: BNP (last 3 results)  Recent Labs  10/31/15 1927 11/09/15 0018  BNP 197.6* 232.1*    ProBNP (last 3 results) No results for input(s): PROBNP in the last 8760 hours.  CBG: No results for input(s): GLUCAP in the last 168 hours.     SignedRhetta Mura:  Chery Giusto, JAI-GURMUKH  [facility] 11/11/2015, 2:03 PM

## 2015-11-17 ENCOUNTER — Encounter (HOSPITAL_COMMUNITY): Payer: Self-pay | Admitting: *Deleted

## 2015-11-17 ENCOUNTER — Emergency Department (HOSPITAL_COMMUNITY): Payer: Medicare Other

## 2015-11-17 ENCOUNTER — Inpatient Hospital Stay (HOSPITAL_COMMUNITY)
Admission: EM | Admit: 2015-11-17 | Discharge: 2015-11-23 | DRG: 871 | Disposition: A | Discharge status: Hospice | Payer: Medicare Other | Attending: Internal Medicine | Admitting: Internal Medicine

## 2015-11-17 DIAGNOSIS — J9601 Acute respiratory failure with hypoxia: Secondary | ICD-10-CM | POA: Diagnosis present

## 2015-11-17 DIAGNOSIS — R197 Diarrhea, unspecified: Secondary | ICD-10-CM | POA: Diagnosis present

## 2015-11-17 DIAGNOSIS — A414 Sepsis due to anaerobes: Secondary | ICD-10-CM | POA: Diagnosis present

## 2015-11-17 DIAGNOSIS — R627 Adult failure to thrive: Secondary | ICD-10-CM | POA: Diagnosis present

## 2015-11-17 DIAGNOSIS — I82402 Acute embolism and thrombosis of unspecified deep veins of left lower extremity: Secondary | ICD-10-CM | POA: Diagnosis present

## 2015-11-17 DIAGNOSIS — Z91040 Latex allergy status: Secondary | ICD-10-CM | POA: Diagnosis not present

## 2015-11-17 DIAGNOSIS — I2699 Other pulmonary embolism without acute cor pulmonale: Secondary | ICD-10-CM | POA: Diagnosis present

## 2015-11-17 DIAGNOSIS — R0602 Shortness of breath: Secondary | ICD-10-CM | POA: Diagnosis not present

## 2015-11-17 DIAGNOSIS — R609 Edema, unspecified: Secondary | ICD-10-CM

## 2015-11-17 DIAGNOSIS — J189 Pneumonia, unspecified organism: Secondary | ICD-10-CM | POA: Clinically undetermined

## 2015-11-17 DIAGNOSIS — A419 Sepsis, unspecified organism: Secondary | ICD-10-CM | POA: Diagnosis present

## 2015-11-17 DIAGNOSIS — Z515 Encounter for palliative care: Secondary | ICD-10-CM | POA: Diagnosis present

## 2015-11-17 DIAGNOSIS — E871 Hypo-osmolality and hyponatremia: Secondary | ICD-10-CM | POA: Diagnosis not present

## 2015-11-17 DIAGNOSIS — A047 Enterocolitis due to Clostridium difficile: Secondary | ICD-10-CM | POA: Diagnosis present

## 2015-11-17 DIAGNOSIS — R6520 Severe sepsis without septic shock: Secondary | ICD-10-CM

## 2015-11-17 DIAGNOSIS — Z7982 Long term (current) use of aspirin: Secondary | ICD-10-CM | POA: Diagnosis not present

## 2015-11-17 DIAGNOSIS — N179 Acute kidney failure, unspecified: Secondary | ICD-10-CM

## 2015-11-17 DIAGNOSIS — Y95 Nosocomial condition: Secondary | ICD-10-CM | POA: Diagnosis present

## 2015-11-17 DIAGNOSIS — Z66 Do not resuscitate: Secondary | ICD-10-CM | POA: Diagnosis present

## 2015-11-17 DIAGNOSIS — Z880 Allergy status to penicillin: Secondary | ICD-10-CM

## 2015-11-17 DIAGNOSIS — I214 Non-ST elevation (NSTEMI) myocardial infarction: Secondary | ICD-10-CM | POA: Diagnosis present

## 2015-11-17 DIAGNOSIS — E876 Hypokalemia: Secondary | ICD-10-CM | POA: Diagnosis present

## 2015-11-17 DIAGNOSIS — R140 Abdominal distension (gaseous): Secondary | ICD-10-CM

## 2015-11-17 DIAGNOSIS — E860 Dehydration: Secondary | ICD-10-CM | POA: Diagnosis present

## 2015-11-17 DIAGNOSIS — I248 Other forms of acute ischemic heart disease: Secondary | ICD-10-CM | POA: Diagnosis present

## 2015-11-17 DIAGNOSIS — N170 Acute kidney failure with tubular necrosis: Secondary | ICD-10-CM | POA: Diagnosis present

## 2015-11-17 DIAGNOSIS — I10 Essential (primary) hypertension: Secondary | ICD-10-CM | POA: Diagnosis present

## 2015-11-17 DIAGNOSIS — R7989 Other specified abnormal findings of blood chemistry: Secondary | ICD-10-CM | POA: Diagnosis not present

## 2015-11-17 DIAGNOSIS — A0472 Enterocolitis due to Clostridium difficile, not specified as recurrent: Secondary | ICD-10-CM | POA: Diagnosis present

## 2015-11-17 DIAGNOSIS — Z881 Allergy status to other antibiotic agents status: Secondary | ICD-10-CM

## 2015-11-17 DIAGNOSIS — Z79899 Other long term (current) drug therapy: Secondary | ICD-10-CM

## 2015-11-17 DIAGNOSIS — R778 Other specified abnormalities of plasma proteins: Secondary | ICD-10-CM | POA: Diagnosis present

## 2015-11-17 LAB — CBC WITH DIFFERENTIAL/PLATELET
Basophils Absolute: 0 10*3/uL (ref 0.0–0.1)
Basophils Relative: 0 %
EOS ABS: 0 10*3/uL (ref 0.0–0.7)
Eosinophils Relative: 0 %
HEMATOCRIT: 36.8 % (ref 36.0–46.0)
HEMOGLOBIN: 12.1 g/dL (ref 12.0–15.0)
LYMPHS ABS: 1.1 10*3/uL (ref 0.7–4.0)
LYMPHS PCT: 4 %
MCH: 25 pg — AB (ref 26.0–34.0)
MCHC: 32.9 g/dL (ref 30.0–36.0)
MCV: 76 fL — AB (ref 78.0–100.0)
MONOS PCT: 5 %
Monocytes Absolute: 1.1 10*3/uL — ABNORMAL HIGH (ref 0.1–1.0)
NEUTROS ABS: 22.7 10*3/uL — AB (ref 1.7–7.7)
NEUTROS PCT: 91 %
Platelets: 263 10*3/uL (ref 150–400)
RBC: 4.84 MIL/uL (ref 3.87–5.11)
RDW: 13.4 % (ref 11.5–15.5)
WBC: 24.9 10*3/uL — ABNORMAL HIGH (ref 4.0–10.5)

## 2015-11-17 LAB — I-STAT TROPONIN, ED: Troponin i, poc: 0.18 ng/mL (ref 0.00–0.08)

## 2015-11-17 LAB — URINALYSIS, ROUTINE W REFLEX MICROSCOPIC
GLUCOSE, UA: NEGATIVE mg/dL
HGB URINE DIPSTICK: NEGATIVE
Ketones, ur: NEGATIVE mg/dL
Nitrite: NEGATIVE
PH: 5 (ref 5.0–8.0)
Protein, ur: NEGATIVE mg/dL
SPECIFIC GRAVITY, URINE: 1.014 (ref 1.005–1.030)

## 2015-11-17 LAB — URINE MICROSCOPIC-ADD ON

## 2015-11-17 LAB — BRAIN NATRIURETIC PEPTIDE: B Natriuretic Peptide: 370.3 pg/mL — ABNORMAL HIGH (ref 0.0–100.0)

## 2015-11-17 LAB — BASIC METABOLIC PANEL
ANION GAP: 8 (ref 5–15)
BUN: 28 mg/dL — AB (ref 6–20)
CHLORIDE: 87 mmol/L — AB (ref 101–111)
CO2: 31 mmol/L (ref 22–32)
Calcium: 9.9 mg/dL (ref 8.9–10.3)
Creatinine, Ser: 0.92 mg/dL (ref 0.44–1.00)
GFR calc Af Amer: >60 mL/min (ref >60)
GFR, EST NON AFRICAN AMERICAN: 54 mL/min — AB (ref >60)
GLUCOSE: 110 mg/dL — AB (ref 65–99)
POTASSIUM: 3.7 mmol/L (ref 3.5–5.1)
Sodium: 126 mmol/L — ABNORMAL LOW (ref 135–145)

## 2015-11-17 LAB — I-STAT CG4 LACTIC ACID, ED: LACTIC ACID, VENOUS: 1.02 mmol/L (ref 0.5–2.0)

## 2015-11-17 MED ORDER — THIAMINE HCL 100 MG/ML IJ SOLN
100.0000 mg | Freq: Once | INTRAMUSCULAR | Status: AC
  Administered 2015-11-17: 100 mg via INTRAVENOUS
  Filled 2015-11-17: qty 2

## 2015-11-17 MED ORDER — SODIUM CHLORIDE 0.9 % IV BOLUS (SEPSIS)
1000.0000 mL | Freq: Once | INTRAVENOUS | Status: AC
Start: 2015-11-17 — End: 2015-11-17
  Administered 2015-11-17: 1000 mL via INTRAVENOUS

## 2015-11-17 MED ORDER — ASPIRIN 81 MG PO CHEW
324.0000 mg | CHEWABLE_TABLET | Freq: Once | ORAL | Status: AC
  Administered 2015-11-17: 324 mg via ORAL
  Filled 2015-11-17: qty 4

## 2015-11-17 NOTE — ED Provider Notes (Signed)
Palliative care consult ordered to discuss goals of care.   Doug SouSam Theordore Cisnero, MD 11/17/15 2322

## 2015-11-17 NOTE — ED Notes (Signed)
Pt arrives from home via [facility]. Pt had an acute onset of SOB today about 1.5 hours ago. When fire arrived pt was 92% on RA and unable to speak in full sentences. Upon EMS arrival pt was placed on a nonrebreather and that brought up her up to 99%. Pt is now on 4L at 99%. Pt recently started lisinopril and lasix.

## 2015-11-17 NOTE — ED Provider Notes (Signed)
CSN: [id number]     Arrival date & time 11/17/15  1716 History   First MD Initiated Contact with Patient 11/17/15 2045     Chief Complaint  Patient presents with   Shortness of Breath     (Consider location/radiation/quality/duration/timing/severity/associated sxs/prior Treatment) HPI Complains of generalized weakness and shortness of breath and diarrhea onset approximately 2 weeks ago. Patient feels no better since he left the hospital 11/11/2015 when she was diagnosed for for congestive heart failure and subsequently urinary tract infection. She reports that she finished her antibiotic yesterday. Her niece reports that she's been too weak to walk except with assistance. No treatment prior to coming here other associated symptoms include diminished appetite, no known fever. No cough. No other associated symptoms Past Medical History  Diagnosis Date   Hypertension    Past Surgical History  Procedure Laterality Date   Orif distal radius fracture     Family History  Problem Relation Age of Onset   Stroke Mother    Kidney disease Mother    Arrhythmia Mother    Asthma Father    Asthma Sister    Lung cancer Sister    Heart attack Brother    Social History  Substance Use Topics   Smoking status: Never Smoker    Smokeless tobacco: None   Alcohol Use: No   OB History    No data available     Review of Systems  Constitutional: Positive for appetite change and fatigue.  HENT: Negative.   Respiratory: Positive for shortness of breath.   Cardiovascular: Negative.   Gastrointestinal: Positive for diarrhea.  Musculoskeletal: Positive for gait problem.  Skin: Negative.   Neurological: Negative.   Psychiatric/Behavioral: Negative.   All other systems reviewed and are negative.     Allergies  Sulfa antibiotics; Latex; and Penicillins  Home Medications   Prior to Admission medications   Medication Sig Start Date End Date Taking? Authorizing Provider   amLODipine (NORVASC) 10 MG tablet Take 10 mg by mouth daily.   Yes Historical Provider, MD  aspirin 325 MG tablet Take 325 mg by mouth daily.   Yes Historical Provider, MD  CALCIUM PO Take 1 tablet by mouth daily.   Yes Historical Provider, MD  cefUROXime (CEFTIN) 250 MG tablet Take 1 tablet (250 mg total) by mouth 2 (two) times daily with a meal. 11/11/15  Yes Rhetta MuraJai-Gurmukh Samtani, MD  cholecalciferol (VITAMIN D) 1000 UNITS tablet Take 2,000 Units by mouth daily.   Yes Historical Provider, MD  Cyanocobalamin (VITAMIN B 12 PO) Take 1 tablet by mouth daily.   Yes Historical Provider, MD  furosemide (LASIX) 40 MG tablet Take 1 tablet (40 mg total) by mouth daily. 11/11/15  Yes Rhetta MuraJai-Gurmukh Samtani, MD  lisinopril (PRINIVIL,ZESTRIL) 2.5 MG tablet Take 1 tablet (2.5 mg total) by mouth daily. 11/11/15  Yes Rhetta MuraJai-Gurmukh Samtani, MD  Multiple Vitamins-Minerals (EYE VITAMINS) CAPS Take 1 capsule by mouth daily.   Yes Historical Provider, MD  Polyethyl Glycol-Propyl Glycol (SYSTANE OP) Apply 1 drop to eye daily. For both eyes   Yes Historical Provider, MD   BP 132/53 mmHg   Pulse 85   Temp(Src) 99.4 F (37.4 C) (Rectal)   Resp 21   Ht 5\' 1"  (1.549 m)   Wt 105 lb (47.628 kg)   BMI 19.85 kg/m2   SpO2 98% Physical Exam  Constitutional:  Frail chronically ill-appearing  HENT:  Head: Normocephalic and atraumatic.  Mucous membranes dry  Eyes: Conjunctivae are normal. Pupils are equal, round,  and reactive to light.  Neck: Neck supple. No tracheal deviation present. No thyromegaly present.  Cardiovascular: Normal rate and regular rhythm.   No murmur heard. Pulmonary/Chest: Effort normal and breath sounds normal.  Abdominal: Soft. Bowel sounds are normal. She exhibits no distension. There is no tenderness.  Musculoskeletal: Normal range of motion. She exhibits no edema or tenderness.  Neurological: She is alert. Coordination normal.  Skin: Skin is warm and dry. No rash noted.  Psychiatric: She has a normal  mood and affect.  Nursing note and vitals reviewed.   ED Course  Procedures (including critical care time) Labs Review Labs Reviewed - No data to display  Imaging Review No results found. I have personally reviewed and evaluated these images and lab results as part of my medical decision-making.   EKG Interpretation   Date/Time:  Thursday November 17 2015 17:29:37 EST Ventricular Rate:  89 PR Interval:  123 QRS Duration: 93 QT Interval:  371 QTC Calculation: 451 R Axis:   67 Text Interpretation:  Sinus rhythm Low voltage, extremity leads No  significant change since last tracing Confirmed by Ethelda Chick  MD, Geovanny Sartin  [phone]) on 11/17/2015 5:31:43 PM     Chest xray viewed by me Results for orders placed or performed during the hospital encounter of 11/17/15  CBC with Differential/Platelet  Result Value Ref Range   WBC 24.9 (H) 4.0 - 10.5 K/uL   RBC 4.84 3.87 - 5.11 MIL/uL   Hemoglobin 12.1 12.0 - 15.0 g/dL   HCT 01.0 27.2 - 53.6 %   MCV 76.0 (L) 78.0 - 100.0 fL   MCH 25.0 (L) 26.0 - 34.0 pg   MCHC 32.9 30.0 - 36.0 g/dL   RDW 64.4 03.4 - 74.2 %   Platelets 263 150 - 400 K/uL   Neutrophils Relative % 91 %   Neutro Abs 22.7 (H) 1.7 - 7.7 K/uL   Lymphocytes Relative 4 %   Lymphs Abs 1.1 0.7 - 4.0 K/uL   Monocytes Relative 5 %   Monocytes Absolute 1.1 (H) 0.1 - 1.0 K/uL   Eosinophils Relative 0 %   Eosinophils Absolute 0.0 0.0 - 0.7 K/uL   Basophils Relative 0 %   Basophils Absolute 0.0 0.0 - 0.1 K/uL  Brain natriuretic peptide  Result Value Ref Range   B Natriuretic Peptide 370.3 (H) 0.0 - 100.0 pg/mL  Urinalysis, Routine w reflex microscopic (not at [facility])  Result Value Ref Range   Color, Urine AMBER (A) YELLOW   APPearance CLOUDY (A) CLEAR   Specific Gravity, Urine 1.014 1.005 - 1.030   pH 5.0 5.0 - 8.0   Glucose, UA NEGATIVE NEGATIVE mg/dL   Hgb urine dipstick NEGATIVE NEGATIVE   Bilirubin Urine SMALL (A) NEGATIVE   Ketones, ur NEGATIVE NEGATIVE mg/dL   Protein,  ur NEGATIVE NEGATIVE mg/dL   Nitrite NEGATIVE NEGATIVE   Leukocytes, UA SMALL (A) NEGATIVE  Basic metabolic panel  Result Value Ref Range   Sodium 126 (L) 135 - 145 mmol/L   Potassium 3.7 3.5 - 5.1 mmol/L   Chloride 87 (L) 101 - 111 mmol/L   CO2 31 22 - 32 mmol/L   Glucose, Bld 110 (H) 65 - 99 mg/dL   BUN 28 (H) 6 - 20 mg/dL   Creatinine, Ser 5.95 0.44 - 1.00 mg/dL   Calcium 9.9 8.9 - 63.8 mg/dL   GFR calc non Af Amer 54 (L) >60 mL/min   GFR calc Af Amer >60 >60 mL/min   Anion gap 8 5 -  15  Urine microscopic-add on  Result Value Ref Range   Squamous Epithelial / LPF 0-5 (A) NONE SEEN   WBC, UA 6-30 0 - 5 WBC/hpf   RBC / HPF 0-5 0 - 5 RBC/hpf   Bacteria, UA FEW (A) NONE SEEN   Casts HYALINE CASTS (A) NEGATIVE  I-stat troponin, ED  Result Value Ref Range   Troponin i, poc 0.18 (HH) 0.00 - 0.08 ng/mL   Comment NOTIFIED PHYSICIAN    Comment 3          I-Stat CG4 Lactic Acid, ED  Result Value Ref Range   Lactic Acid, Venous 1.02 0.5 - 2.0 mmol/L   Dg Chest 2 View  10/31/2015  CLINICAL DATA:  Shortness of breath and weakness, 4 days duration. EXAM: CHEST  2 VIEW COMPARISON:  08/09/2015 and previous FINDINGS: Heart size is normal. There is atherosclerosis of the aorta. There is chronic blunting of the posterior costophrenic angles. The lungs show slightly increased chronic interstitial markings but there is no evidence of focal active infiltrate, mass or collapse. There are old rib fractures on the left. No acute bone finding. IMPRESSION: Atherosclerosis of the aorta. Mild chronic interstitial lung markings. No focal or active process otherwise. Chronic blunting of the posterior costophrenic angles presumed secondary to scarring. Electronically Signed   By: Paulina Fusi M.D.   On: 10/31/2015 19:36   Nm Myocar Multi W/spect W/wall Motion / Ef  11/11/2015  CLINICAL DATA:  81 year old female with wide-complex tachycardia. EXAM: MYOCARDIAL IMAGING WITH SPECT (REST AND PHARMACOLOGIC-STRESS)  GATED LEFT VENTRICULAR WALL MOTION STUDY LEFT VENTRICULAR EJECTION FRACTION TECHNIQUE: Standard myocardial SPECT imaging was performed after resting intravenous injection of 10 mCi Tc-13m sestamibi. Subsequently, intravenous infusion of Lexiscan was performed under the supervision of the Cardiology staff. At peak effect of the drug, 30 mCi Tc-15m sestamibi was injected intravenously and standard myocardial SPECT imaging was performed. Quantitative gated imaging was also performed to evaluate left ventricular wall motion, and estimate left ventricular ejection fraction. COMPARISON:  Chest radiograph 11/09/2015 FINDINGS: Perfusion: There is no evidence to suggest reversible ischemia. However, there are decreased counts along the inferior wall on both the rest and stress images and this is probably related to increased uptake in the upper abdomen. As a result, limited evaluation of the inferior wall. Wall Motion: Normal left ventricular wall motion. No left ventricular dilation. Left Ventricular Ejection Fraction: 70 % End diastolic volume 51 ml End systolic volume 15 ml IMPRESSION: 1. No evidence for reversible ischemia but limited evaluation of the inferior wall as described. 2. Normal left ventricular wall motion. 3. Left ventricular ejection fraction is 70%. 4. Low-risk stress test findings*. *2012 Appropriate Use Criteria for Coronary Revascularization Focused Update: J Am Coll Cardiol. 2012;59(9):857-881. http://content.dementiazones.com.aspx?articleid=1201161 Electronically Signed   By: Richarda Overlie M.D.   On: 11/11/2015 13:50   Dg Chest Port 1 View  11/17/2015  CLINICAL DATA:  81 year old female with shortness of breath EXAM: PORTABLE CHEST 1 VIEW COMPARISON:  Chest radiograph dated 11/09/2015 FINDINGS: Single-view of the chest demonstrate emphysematous changes of the lungs. There is an area of increased density at the left mid and lower lung field which appears slightly improved compared to the prior  study. Small left pleural effusion may be present. The cardiac silhouette is within normal limits. The osseous structures are grossly unremarkable. IMPRESSION: Emphysema with partial interval improvement of the previously seen left mid and lower lung airspace density. Electronically Signed   By: Ceasar Mons.D.  On: 11/17/2015 22:54   Dg Chest Port 1 View  11/09/2015  CLINICAL DATA:  Shortness of breath and nausea tonight. EXAM: PORTABLE CHEST 1 VIEW COMPARISON:  10/31/2015 FINDINGS: Mild cardiac enlargement without significant vascular congestion. Emphysematous changes with chronic interstitial fibrosis in the lungs. Increased density in the left lung base may be due to shallow inspiration but infiltration or atelectasis in the left base is not excluded. No pneumothorax. Calcified and tortuous aorta. IMPRESSION: Cardiac enlargement. Emphysematous changes with interstitial fibrosis in the lungs. Possible superimposed infiltration or atelectasis in the left base. Electronically Signed   By: Burman Nieves M.D.   On: 11/09/2015 00:29    MDM  Spoke with Dr.Kakrakandy and admit to telemetry. Clinically patient not in congestive heart failure, she is lying flat. Suggest gentle IV hydration. Stool samples pending for C. difficile and culture. Possibility of NSTEMI given dyspnea and mildly elevated troponin. Treat with aspirin Diagnosis #1 weakness #2 dehydration #3 renal insufficiency #4 dyspnea Final diagnoses:  None  #5 hyponatremia    11/11/2015  Doug Sou, MD 11/17/15 2318

## 2015-11-17 NOTE — ED Notes (Signed)
Pt's room air O2 saturation is 88 %.

## 2015-11-18 ENCOUNTER — Inpatient Hospital Stay (HOSPITAL_COMMUNITY): Payer: Medicare Other

## 2015-11-18 ENCOUNTER — Encounter (HOSPITAL_COMMUNITY): Payer: Self-pay | Admitting: Internal Medicine

## 2015-11-18 DIAGNOSIS — R0602 Shortness of breath: Secondary | ICD-10-CM | POA: Diagnosis present

## 2015-11-18 DIAGNOSIS — E871 Hypo-osmolality and hyponatremia: Secondary | ICD-10-CM | POA: Diagnosis present

## 2015-11-18 DIAGNOSIS — R197 Diarrhea, unspecified: Secondary | ICD-10-CM

## 2015-11-18 DIAGNOSIS — Z789 Other specified health status: Secondary | ICD-10-CM

## 2015-11-18 LAB — COMPREHENSIVE METABOLIC PANEL
ALBUMIN: 1.8 g/dL — AB (ref 3.5–5.0)
ALK PHOS: 69 U/L (ref 38–126)
ALT: 9 U/L — AB (ref 14–54)
AST: 20 U/L (ref 15–41)
Anion gap: 7 (ref 5–15)
BUN: 24 mg/dL — AB (ref 6–20)
CALCIUM: 9.8 mg/dL (ref 8.9–10.3)
CO2: 31 mmol/L (ref 22–32)
CREATININE: 0.74 mg/dL (ref 0.44–1.00)
Chloride: 90 mmol/L — ABNORMAL LOW (ref 101–111)
GFR calc Af Amer: >60 mL/min (ref >60)
GFR calc non Af Amer: >60 mL/min (ref >60)
GLUCOSE: 101 mg/dL — AB (ref 65–99)
Potassium: 3.4 mmol/L — ABNORMAL LOW (ref 3.5–5.1)
SODIUM: 128 mmol/L — AB (ref 135–145)
Total Bilirubin: 0.7 mg/dL (ref 0.3–1.2)
Total Protein: 4.3 g/dL — ABNORMAL LOW (ref 6.5–8.1)

## 2015-11-18 LAB — C DIFFICILE QUICK SCREEN W PCR REFLEX
C DIFFICLE (CDIFF) ANTIGEN: POSITIVE — AB
C Diff toxin: POSITIVE — AB

## 2015-11-18 LAB — CBC WITH DIFFERENTIAL/PLATELET
BASOS PCT: 0 %
Basophils Absolute: 0 10*3/uL (ref 0.0–0.1)
EOS ABS: 0 10*3/uL (ref 0.0–0.7)
Eosinophils Relative: 0 %
HCT: 33.6 % — ABNORMAL LOW (ref 36.0–46.0)
HEMOGLOBIN: 11.2 g/dL — AB (ref 12.0–15.0)
Lymphocytes Relative: 3 %
Lymphs Abs: 0.8 10*3/uL (ref 0.7–4.0)
MCH: 25.4 pg — AB (ref 26.0–34.0)
MCHC: 33.3 g/dL (ref 30.0–36.0)
MCV: 76.2 fL — ABNORMAL LOW (ref 78.0–100.0)
MONO ABS: 1.4 10*3/uL — AB (ref 0.1–1.0)
Monocytes Relative: 5 %
NEUTROS ABS: 25 10*3/uL — AB (ref 1.7–7.7)
Neutrophils Relative %: 92 %
PLATELETS: 259 10*3/uL (ref 150–400)
RBC: 4.41 MIL/uL (ref 3.87–5.11)
RDW: 13.7 % (ref 11.5–15.5)
WBC: 27.2 10*3/uL — ABNORMAL HIGH (ref 4.0–10.5)

## 2015-11-18 LAB — TROPONIN I
Troponin I: 0.15 ng/mL — ABNORMAL HIGH (ref <0.031)
Troponin I: 0.17 ng/mL — ABNORMAL HIGH (ref <0.031)
Troponin I: 0.16 ng/mL — ABNORMAL HIGH (ref <0.031)

## 2015-11-18 LAB — D-DIMER, QUANTITATIVE (NOT AT ARMC): D DIMER QUANT: 3.31 ug{FEU}/mL — AB (ref 0.00–0.50)

## 2015-11-18 MED ORDER — METRONIDAZOLE IN NACL 5-0.79 MG/ML-% IV SOLN
500.0000 mg | Freq: Three times a day (TID) | INTRAVENOUS | Status: DC
  Administered 2015-11-18: 500 mg via INTRAVENOUS
  Filled 2015-11-18 (×3): qty 100

## 2015-11-18 MED ORDER — AMLODIPINE BESYLATE 10 MG PO TABS
10.0000 mg | ORAL_TABLET | Freq: Every day | ORAL | Status: DC
  Administered 2015-11-18 – 2015-11-21 (×3): 10 mg via ORAL
  Filled 2015-11-18 (×4): qty 1

## 2015-11-18 MED ORDER — VANCOMYCIN 50 MG/ML ORAL SOLUTION
125.0000 mg | Freq: Four times a day (QID) | ORAL | Status: DC
  Administered 2015-11-18 – 2015-11-19 (×8): 125 mg via ORAL
  Filled 2015-11-18 (×12): qty 2.5

## 2015-11-18 MED ORDER — VITAMIN D 1000 UNITS PO TABS
2000.0000 [IU] | ORAL_TABLET | Freq: Every day | ORAL | Status: DC
  Administered 2015-11-18 – 2015-11-22 (×5): 2000 [IU] via ORAL
  Filled 2015-11-18 (×5): qty 2

## 2015-11-18 MED ORDER — ONDANSETRON HCL 4 MG PO TABS: 4.0000 mg | ORAL_TABLET | Freq: Four times a day (QID) | ORAL | Status: DC | PRN

## 2015-11-18 MED ORDER — IOHEXOL 350 MG/ML SOLN
80.0000 mL | Freq: Once | INTRAVENOUS | Status: AC | PRN
  Administered 2015-11-18: 80 mL via INTRAVENOUS

## 2015-11-18 MED ORDER — ACETAMINOPHEN 650 MG RE SUPP: 650.0000 mg | Freq: Four times a day (QID) | RECTAL | Status: DC | PRN

## 2015-11-18 MED ORDER — ONDANSETRON HCL 4 MG/2ML IJ SOLN: 4.0000 mg | Freq: Four times a day (QID) | INTRAMUSCULAR | Status: DC | PRN

## 2015-11-18 MED ORDER — SODIUM CHLORIDE 0.9 % IV SOLN
INTRAVENOUS | Status: AC
  Administered 2015-11-18: 01:00:00 via INTRAVENOUS

## 2015-11-18 MED ORDER — ASPIRIN 325 MG PO TABS
325.0000 mg | ORAL_TABLET | Freq: Every day | ORAL | Status: DC
  Administered 2015-11-18 – 2015-11-20 (×3): 325 mg via ORAL
  Filled 2015-11-18 (×3): qty 1

## 2015-11-18 MED ORDER — RIVAROXABAN 20 MG PO TABS: 20.0000 mg | ORAL_TABLET | Freq: Every day | ORAL | Status: DC

## 2015-11-18 MED ORDER — RIVAROXABAN 15 MG PO TABS
15.0000 mg | ORAL_TABLET | Freq: Two times a day (BID) | ORAL | Status: DC
  Administered 2015-11-18 – 2015-11-22 (×10): 15 mg via ORAL
  Filled 2015-11-18 (×10): qty 1

## 2015-11-18 MED ORDER — SODIUM CHLORIDE 0.9 % IJ SOLN
3.0000 mL | Freq: Two times a day (BID) | INTRAMUSCULAR | Status: DC
  Administered 2015-11-18 – 2015-11-21 (×5): 3 mL via INTRAVENOUS

## 2015-11-18 MED ORDER — ENOXAPARIN SODIUM 40 MG/0.4ML ~~LOC~~ SOLN
40.0000 mg | SUBCUTANEOUS | Status: DC
  Administered 2015-11-18: 40 mg via SUBCUTANEOUS
  Filled 2015-11-18: qty 0.4

## 2015-11-18 MED ORDER — POTASSIUM CHLORIDE CRYS ER 20 MEQ PO TBCR
40.0000 meq | EXTENDED_RELEASE_TABLET | Freq: Once | ORAL | Status: AC
  Administered 2015-11-18: 40 meq via ORAL
  Filled 2015-11-18: qty 2

## 2015-11-18 MED ORDER — ACETAMINOPHEN 325 MG PO TABS
650.0000 mg | ORAL_TABLET | Freq: Four times a day (QID) | ORAL | Status: DC | PRN
  Filled 2015-11-18: qty 2

## 2015-11-18 MED ORDER — LISINOPRIL 2.5 MG PO TABS
2.5000 mg | ORAL_TABLET | Freq: Every day | ORAL | Status: DC
  Administered 2015-11-18 – 2015-11-19 (×2): 2.5 mg via ORAL
  Filled 2015-11-18 (×3): qty 1

## 2015-11-18 NOTE — Progress Notes (Signed)
Utilization review completed. Martie Fulgham, RN, BSN. °

## 2015-11-18 NOTE — Consult Note (Signed)
Met briefly with niece Reino Bellis and brother, [place].  Explained role of palliative care and directed toward goals of care conversation.  Patient is currently being cleaned by staff for another episode of diarrhea.  Niece immediately became tearful when advanced care planning, HCPOA and living will were mentioned.  She said she has just had a really stressful day, not verbalizing the specific concern, and that she was told that we would be meeting with them on Monday.  I certainly wanted to be respectful of her emotional state and agreed to follow up.    Information obtained is that Mrs. Kreh has only recently suffered from health problems. Her normal state is very active and "able to run circles" around everyone.  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  This has recently become a source of increased stress, he has been declining.  Family is very supportive, they take turns staying with Mrs. Platte and her husband at their home, "they are never without 24hr. Supervision"    Plan, shadow over the weekend, encouraged family to formulate some questions related to the palliative philosophy. Will plan to meet formally on Monday.  Kizzie Fantasia, RN-BC, MSN, [facility]

## 2015-11-18 NOTE — Care Management Note (Addendum)
Case Management Note  Patient Details  Name: Anna Gonzales MRN: [id number] Date of Birth: 1926-04-03  Subjective/Objective: Principal Problem Sepsis: Secondary to C. difficile colitis. Started on oral vancomycin. Blood cultures pending. Active Problems:C. difficile colitis: Continue vancomycin SOB : Probably secondary to above. CT scan chest positive for PE-start Xarelto.    Action/Plan: CM has benefits check in process for Xarelto, Will make pt aware of cost once completed. 30 day free card to be provided to pt before d/c. CM did call [facility] in [place] and medication is available. Pt will need Rx for 30 day free. [facility] listed below, No further needs from CM at this time.     Expected Discharge Date:                  Expected Discharge Plan:  Home w Home Health Services  In-House Referral:  NA  Discharge planning Services  CM Consult, Medication Assistance  Post Acute Care Choice:  Home Health, Resumption of Svcs/PTA Provider Choice offered to:  Patient  DME Arranged:  N/A DME Agency:  NA  HH Arranged:  RN, PT, OT [facility]  Status of Service:  Completed, signed off  Medicare Important Message Given:    Date Medicare IM Given:    Medicare IM give by:    Date Additional Medicare IM Given:    Additional Medicare Important Message give by:     If discussed at Long Length of Stay Meetings, dates discussed:    Additional Comments: 1504 11-23-15 Tomi BambergerBrenda Graves-Bigelow, RN,BSN [phone] [facility]. CSW assisting with disposition needs.    1507 11-21-15 Tomi BambergerBrenda Graves-Bigelow, RN,BSN [phone] Pt continues on po vancomycin. New IV zosyn for PNA. CSW is working with pt in regards to SNF placement. CM will continue to monitor.   Gala LewandowskyGraves-Bigelow, Jaelee Laughter Kaye, RN 11/18/2015, 4:21 PM

## 2015-11-18 NOTE — Discharge Instructions (Signed)
Information on my medicine - XARELTO (rivaroxaban)  This medication education was reviewed with me or my healthcare representative as part of my discharge preparation.  The pharmacist that spoke with me during my hospital stay was:  Charles Andringa, Judie BonusKimberly Ballard, [facility]  WHY WAS XARELTO PRESCRIBED FOR YOU? Xarelto was prescribed to treat blood clots that may have been found in the veins of your legs (deep vein thrombosis) or in your lungs (pulmonary embolism) and to reduce the risk of them occurring again.  What do you need to know about Xarelto? The starting dose is one 15 mg tablet taken TWICE daily with food for the FIRST 21 DAYS then on  12/10/15  the dose is changed to one 20 mg tablet taken ONCE A DAY with your evening meal.  DO NOT stop taking Xarelto without talking to the health care provider who prescribed the medication.  Refill your prescription for 20 mg tablets before you run out.  After discharge, you should have regular check-up appointments with your healthcare provider that is prescribing your Xarelto.  In the future your dose may need to be changed if your kidney function changes by a significant amount.  What do you do if you miss a dose? If you are taking Xarelto TWICE DAILY and you miss a dose, take it as soon as you remember. You may take two 15 mg tablets (total 30 mg) at the same time then resume your regularly scheduled 15 mg twice daily the next day.  If you are taking Xarelto ONCE DAILY and you miss a dose, take it as soon as you remember on the same day then continue your regularly scheduled once daily regimen the next day. Do not take two doses of Xarelto at the same time.   Important Safety Information Xarelto is a blood thinner medicine that can cause bleeding. You should call your healthcare provider right away if you experience any of the following: ? Bleeding from an injury or your nose that does not stop. ? Unusual colored urine (red or dark brown) or  unusual colored stools (red or black). ? Unusual bruising for unknown reasons. ? A serious fall or if you hit your head (even if there is no bleeding).  Some medicines may interact with Xarelto and might increase your risk of bleeding while on Xarelto. To help avoid this, consult your healthcare provider or pharmacist prior to using any new prescription or non-prescription medications, including herbals, vitamins, non-steroidal anti-inflammatory drugs (NSAIDs) and supplements.  This website has more information on Xarelto: [email or web address].

## 2015-11-18 NOTE — Progress Notes (Addendum)
PATIENT DETAILS Name: Anna Gonzales Age: 81 y.o. Sex: female Date of Birth: 10-05-1926 Admit Date: 11/17/2015 Admitting Physician Eduard Clos, MD ZOX:[id number],WJX Maisie Fus, MD  Subjective: Diarrhea continues-SOB better  Assessment/Plan: Principal Problem: Sepsis: Secondary to C. difficile colitis. Start oral vancomycin. Blood cultures pending. Follow clinical course  Active Problems: C. difficile colitis: Continue vancomycin, follow clinical course  SOB (shortness of breath): Probably secondary to above. Lungs clear to exam-await CT angiogram chest. Supportive care with oxygen. Seems very comfortable on my exam, not using accessory muscles and easily speaking in full sentences.  Addendum: CT scan chest positive for PE-start Xarelto. Check LE Doppler. Niece Updated. Side effects-including bleeding discussed with family.  Elevated troponin: Mildly elevated troponin-trend is flat and not consistent with ACS-suspect demand ischemia from sepsis. Recent echocardiogram on 11/09/15 EF preserved without any wall motion abnormality, recent nuclear stress test on 11/25 negative for ischemia. EKG negative for acute abnormalities. Denies chest pain.  Hyponatremia: Has chronic hyponatremia baseline-suspect related to volume loss from diarrhea. Follow.  Hypokalemia: Secondary to diarrhea, replete and recheck  Chronic diastolic heart failure: Compensated, Hold diuretics in the setting of sepsis/diarrhea. Follow volume status, daily weights and strict intake output.  Hypertension: Controlled-continue amlodipine and lisinopril  Deconditioning/failure to thrive syndrome: Secondary to advanced age with numerous recent illnesses/hospitalization-PT/Nutrition eval-suspect will require SNF on discharge  Disposition: Remain inpatient  Antimicrobial agents  See below  Anti-infectives    Start     Dose/Rate Route Frequency Ordered Stop   11/18/15 1000  vancomycin  (VANCOCIN) 50 mg/mL oral solution 125 mg     125 mg Oral 4 times daily 11/18/15 0830 12/02/15 0959   11/18/15 0415  metroNIDAZOLE (FLAGYL) IVPB 500 mg  Status:  Discontinued     500 mg 100 mL/hr over 60 Minutes Intravenous Every 8 hours 11/18/15 0409 11/18/15 0829      DVT Prophylaxis: Prophylactic Lovenox   Code Status: DNR-confirmed with niece over the phone  Family Communication Niece Ms Lalla Brothers   Procedures: None  CONSULTS:  None  Time spent 25 minutes-Greater than 50% of this time was spent in counseling, explanation of diagnosis, planning of further management, and coordination of care.  MEDICATIONS: Scheduled Meds:  amLODipine  10 mg Oral Daily   aspirin  325 mg Oral Daily   cholecalciferol  2,000 Units Oral Daily   enoxaparin (LOVENOX) injection  40 mg Subcutaneous Q24H   lisinopril  2.5 mg Oral Daily   sodium chloride  3 mL Intravenous Q12H   vancomycin  125 mg Oral QID   Continuous Infusions:  PRN Meds:.acetaminophen **OR** acetaminophen, ondansetron **OR** ondansetron (ZOFRAN) IV    PHYSICAL EXAM: Vital signs in last 24 hours: Filed Vitals:   11/18/15 0000 11/18/15 0050 11/18/15 0053 11/18/15 0407  BP: 134/59  132/49 141/52  Pulse: 81  82 85  Temp:   97.6 F (36.4 C) 97.9 F (36.6 C)  TempSrc:   Oral Oral  Resp: 23  20 18   Height:   5\' 1"  (1.549 m)   Weight:    48.762 kg (107 lb 8 oz)  SpO2: 97% 99% 99% 97%    Weight change:  Filed Weights   11/17/15 1730 11/18/15 0407  Weight: 47.628 kg (105 lb) 48.762 kg (107 lb 8 oz)   Body mass index is 20.32 kg/(m^2).   Gen Exam: Awake and alert with clear speech.  Neck: Supple, No JVD.  Chest: B/L Clear.   CVS: S1 S2 Regular, no murmurs.  Abdomen: soft, BS +, non tender, non distended.  Extremities: no edema, lower extremities warm to touch. Neurologic: Non Focal.   Skin: No Rash.   Wounds: N/A.   Intake/Output from previous day:  Intake/Output Summary (Last 24 hours) at 11/18/15  1252 Last data filed at 11/18/15 1012  Gross per 24 hour  Intake    390 ml  Output    450 ml  Net    -60 ml     LAB RESULTS: CBC  Recent Labs Lab 11/17/15 2112 11/18/15 0537  WBC 24.9* 27.2*  HGB 12.1 11.2*  HCT 36.8 33.6*  PLT 263 259  MCV 76.0* 76.2*  MCH 25.0* 25.4*  MCHC 32.9 33.3  RDW 13.4 13.7  LYMPHSABS 1.1 0.8  MONOABS 1.1* 1.4*  EOSABS 0.0 0.0  BASOSABS 0.0 0.0    Chemistries   Recent Labs Lab 11/17/15 2112 11/18/15 0537  NA 126* 128*  K 3.7 3.4*  CL 87* 90*  CO2 31 31  GLUCOSE 110* 101*  BUN 28* 24*  CREATININE 0.92 0.74  CALCIUM 9.9 9.8    CBG: No results for input(s): GLUCAP in the last 168 hours.  GFR Estimated Creatinine Clearance: 37.4 mL/min (by C-G formula based on Cr of 0.74).  Coagulation profile No results for input(s): INR, PROTIME in the last 168 hours.  Cardiac Enzymes  Recent Labs Lab 11/18/15 0537 11/18/15 1104  TROPONINI 0.15* 0.17*    Invalid input(s): POCBNP  Recent Labs  11/18/15 0537  DDIMER 3.31*   No results for input(s): HGBA1C in the last 72 hours. No results for input(s): CHOL, HDL, LDLCALC, TRIG, CHOLHDL, LDLDIRECT in the last 72 hours. No results for input(s): TSH, T4TOTAL, T3FREE, THYROIDAB in the last 72 hours.  Invalid input(s): FREET3 No results for input(s): VITAMINB12, FOLATE, FERRITIN, TIBC, IRON, RETICCTPCT in the last 72 hours. No results for input(s): LIPASE, AMYLASE in the last 72 hours.  Urine Studies No results for input(s): UHGB, CRYS in the last 72 hours.  Invalid input(s): UACOL, UAPR, USPG, UPH, UTP, UGL, UKET, UBIL, UNIT, UROB, ULEU, UEPI, UWBC, URBC, UBAC, CAST, UCOM, BILUA  MICROBIOLOGY: Recent Results (from the past 240 hour(s))  Culture, Urine     Status: None   Collection Time: 11/09/15  1:42 AM  Result Value Ref Range Status   Specimen Description URINE, RANDOM  Final   Special Requests CX ADDED AT 0315 ON [id number]  Final   Culture >=100,000 COLONIES/mL ESCHERICHIA  COLI  Final   Report Status 11/11/2015 FINAL  Final   Organism ID, Bacteria ESCHERICHIA COLI  Final      Susceptibility   Escherichia coli - MIC*    AMPICILLIN <=2 SENSITIVE Sensitive     CEFAZOLIN <=4 SENSITIVE Sensitive     CEFTRIAXONE <=1 SENSITIVE Sensitive     CIPROFLOXACIN <=0.25 SENSITIVE Sensitive     GENTAMICIN <=1 SENSITIVE Sensitive     IMIPENEM <=0.25 SENSITIVE Sensitive     NITROFURANTOIN <=16 SENSITIVE Sensitive     TRIMETH/SULFA <=20 SENSITIVE Sensitive     AMPICILLIN/SULBACTAM <=2 SENSITIVE Sensitive     PIP/TAZO <=4 SENSITIVE Sensitive     * >=100,000 COLONIES/mL ESCHERICHIA COLI  C difficile quick scan w PCR reflex     Status: Abnormal   Collection Time: 11/18/15  6:36 AM  Result Value Ref Range Status   C Diff antigen POSITIVE (A) NEGATIVE Final   C Diff toxin POSITIVE (A) NEGATIVE Final  C Diff interpretation   Final    CRITICAL RESULT CALLED TO, READ BACK BY AND VERIFIED WITH:    Comment: SPOKE TO RN W. HIX AT 805 ON 11/17/2016 PER K.PEELE    RADIOLOGY STUDIES/RESULTS: Dg Chest 2 View  10/31/2015  CLINICAL DATA:  Shortness of breath and weakness, 4 days duration. EXAM: CHEST  2 VIEW COMPARISON:  08/09/2015 and previous FINDINGS: Heart size is normal. There is atherosclerosis of the aorta. There is chronic blunting of the posterior costophrenic angles. The lungs show slightly increased chronic interstitial markings but there is no evidence of focal active infiltrate, mass or collapse. There are old rib fractures on the left. No acute bone finding. IMPRESSION: Atherosclerosis of the aorta. Mild chronic interstitial lung markings. No focal or active process otherwise. Chronic blunting of the posterior costophrenic angles presumed secondary to scarring. Electronically Signed   By: Paulina Fusi M.D.   On: 10/31/2015 19:36   Nm Myocar Multi W/spect W/wall Motion / Ef  11/11/2015  CLINICAL DATA:  81 year old female with wide-complex tachycardia. EXAM: MYOCARDIAL IMAGING  WITH SPECT (REST AND PHARMACOLOGIC-STRESS) GATED LEFT VENTRICULAR WALL MOTION STUDY LEFT VENTRICULAR EJECTION FRACTION TECHNIQUE: Standard myocardial SPECT imaging was performed after resting intravenous injection of 10 mCi Tc-41m sestamibi. Subsequently, intravenous infusion of Lexiscan was performed under the supervision of the Cardiology staff. At peak effect of the drug, 30 mCi Tc-32m sestamibi was injected intravenously and standard myocardial SPECT imaging was performed. Quantitative gated imaging was also performed to evaluate left ventricular wall motion, and estimate left ventricular ejection fraction. COMPARISON:  Chest radiograph 11/09/2015 FINDINGS: Perfusion: There is no evidence to suggest reversible ischemia. However, there are decreased counts along the inferior wall on both the rest and stress images and this is probably related to increased uptake in the upper abdomen. As a result, limited evaluation of the inferior wall. Wall Motion: Normal left ventricular wall motion. No left ventricular dilation. Left Ventricular Ejection Fraction: 70 % End diastolic volume 51 ml End systolic volume 15 ml IMPRESSION: 1. No evidence for reversible ischemia but limited evaluation of the inferior wall as described. 2. Normal left ventricular wall motion. 3. Left ventricular ejection fraction is 70%. 4. Low-risk stress test findings*. *2012 Appropriate Use Criteria for Coronary Revascularization Focused Update: J Am Coll Cardiol. 2012;59(9):857-881. http://content.dementiazones.com.aspx?articleid=[email or web address] Electronically Signed   By: Richarda Overlie M.D.   On: 11/11/2015 13:50   Dg Chest Port 1 View  11/17/2015  CLINICAL DATA:  81 year old female with shortness of breath EXAM: PORTABLE CHEST 1 VIEW COMPARISON:  Chest radiograph dated 11/09/2015 FINDINGS: Single-view of the chest demonstrate emphysematous changes of the lungs. There is an area of increased density at the left mid and lower lung field which appears  slightly improved compared to the prior study. Small left pleural effusion may be present. The cardiac silhouette is within normal limits. The osseous structures are grossly unremarkable. IMPRESSION: Emphysema with partial interval improvement of the previously seen left mid and lower lung airspace density. Electronically Signed   By: Elgie Collard M.D.   On: 11/17/2015 22:54   Dg Chest Port 1 View  11/09/2015  CLINICAL DATA:  Shortness of breath and nausea tonight. EXAM: PORTABLE CHEST 1 VIEW COMPARISON:  10/31/2015 FINDINGS: Mild cardiac enlargement without significant vascular congestion. Emphysematous changes with chronic interstitial fibrosis in the lungs. Increased density in the left lung base may be due to shallow inspiration but infiltration or atelectasis in the left base is not excluded. No pneumothorax. Calcified and tortuous  aorta. IMPRESSION: Cardiac enlargement. Emphysematous changes with interstitial fibrosis in the lungs. Possible superimposed infiltration or atelectasis in the left base. Electronically Signed   By: Burman NievesWilliam  Stevens M.D.   On: 11/09/2015 00:29    Jeoffrey MassedGHIMIRE,Sheridan Gettel, MD  [facility] Pager:[phone]  If 7PM-7AM, please contact night-coverage [email or web address] Password [email or web address] 11/18/2015, 12:52 PM   LOS: 1 day

## 2015-11-18 NOTE — Progress Notes (Signed)
Received phone call from lab. Pt positive for c. Diff. Will continue to monitor.  Reginold AgentWhitney Janneth Krasner, RN

## 2015-11-18 NOTE — Progress Notes (Signed)
PT Cancellation Note  Patient Details Name: Anna Gonzales MRN: [id number] DOB: 1926-02-28   Cancelled Treatment:    Reason Eval/Treat Not Completed: Medical issues which prohibited therapy.  Patient with new PE.  Will return tomorrow for PT evaluation.   Vena AustriaDavis, Israa Caban H 11/18/2015, 3:52 PM Durenda HurtSusan H. Renaldo Fiddleravis, PT, [facility] Pager [phone]

## 2015-11-18 NOTE — H&P (Signed)
[facility] History and Physical  Anna Gonzales ZOX:[id number] DOB: 04/28/1926 DOA: 11/17/2015  Referring physician: Dr. Rennis ChrisJacobowitz. PCP: Ginette OttoSTONEKING,HAL THOMAS, MD  Specialists: None.  Chief Complaint: Shortness of breath and weakness.  HPI: Anna Radarnez B Arocho is a 81 y.o. female who was recently admitted for pyelonephritis and CHF last week was brought to the ER after patient was found to have increasing shortness of breath and weakness. Patient states she became short of breath last evening while sitting. Denies any chest pain or productive cough. Since discharge patient states she has been having multiple episodes of diarrhea and had at least 5 episodes yesterday. Denies any nausea vomiting or abdominal pain. In the ER patient's labs show significant leukocytosis. Patient does not have any definite signs of CHF decompensation at this time and was given 1 L normal seen bolus in the ER. Blood cultures were obtained and patient has been admitted for further management of shortness of breath and diarrhea. On my exam patient is not in distress. Patient states her shortness of breath has improved after admission. Patient has not had any further episodes of diarrhea.   Review of Systems: As presented in the history of presenting illness, rest negative.  Past Medical History  Diagnosis Date   Hypertension    Past Surgical History  Procedure Laterality Date   Orif distal radius fracture     Social History:  reports that she has never smoked. She does not have any smokeless tobacco history on file. She reports that she does not drink alcohol or use illicit drugs. Where does patient live home. Can patient participate in ADLs? Yes.  Allergies  Allergen Reactions   Sulfa Antibiotics Nausea And Vomiting   Latex Hives and Rash   Penicillins Itching and Rash    Has patient had a PCN reaction causing immediate rash, facial/tongue/throat swelling, SOB or lightheadedness with hypotension:  {Yes Has patient had a PCN reaction causing severe rash involving mucus membranes or skin necrosis:NO Has patient had a PCN reaction that equired hospitalizationNO Has patient had a PCN reaction occurring within the last 10 years: NO If all of the above answers are "NO", then may proceed with Cephalosporin use.    Family History:  Family History  Problem Relation Age of Onset   Stroke Mother    Kidney disease Mother    Arrhythmia Mother    Asthma Father    Asthma Sister    Lung cancer Sister    Heart attack Brother       Prior to Admission medications   Medication Sig Start Date End Date Taking? Authorizing Provider  amLODipine (NORVASC) 10 MG tablet Take 10 mg by mouth daily.   Yes Historical Provider, MD  aspirin 325 MG tablet Take 325 mg by mouth daily.   Yes Historical Provider, MD  CALCIUM PO Take 1 tablet by mouth daily.   Yes Historical Provider, MD  cefUROXime (CEFTIN) 250 MG tablet Take 1 tablet (250 mg total) by mouth 2 (two) times daily with a meal. 11/11/15  Yes Rhetta MuraJai-Gurmukh Samtani, MD  cholecalciferol (VITAMIN D) 1000 UNITS tablet Take 2,000 Units by mouth daily.   Yes Historical Provider, MD  Cyanocobalamin (VITAMIN B 12 PO) Take 1 tablet by mouth daily.   Yes Historical Provider, MD  furosemide (LASIX) 40 MG tablet Take 1 tablet (40 mg total) by mouth daily. 11/11/15  Yes Rhetta MuraJai-Gurmukh Samtani, MD  lisinopril (PRINIVIL,ZESTRIL) 2.5 MG tablet Take 1 tablet (2.5 mg total) by mouth daily. 11/11/15  Yes  Rhetta Mura, MD  Multiple Vitamins-Minerals (EYE VITAMINS) CAPS Take 1 capsule by mouth daily.   Yes Historical Provider, MD  Polyethyl Glycol-Propyl Glycol (SYSTANE OP) Apply 1 drop to eye daily. For both eyes   Yes Historical Provider, MD    Physical Exam: Filed Vitals:   11/18/15 0000 11/18/15 0050 11/18/15 0053 11/18/15 0407  BP: 134/59  132/49 141/52  Pulse: 81  82 85  Temp:   97.6 F (36.4 C) 97.9 F (36.6 C)  TempSrc:   Oral Oral  Resp: Height:    (1.549 m)   Weight:    48.762 kg (107 lb 8 oz)  SpO2: 97% 99% 99% 97%     General:  Moderately built and poorly nourished.  Eyes: Anicteric no pallor.  ENT: No discharge from the ears eyes nose and mouth.  Neck: No JVD appreciated. No mass felt.  Cardiovascular: S1 and S2 heard.  Respiratory: No rhonchi or crepitations.  Abdomen: Soft nontender bowel sounds present.  Skin: No rash.  Musculoskeletal: No edema.  Psychiatric: Appears normal.  Neurologic: Alert awake oriented to time place and person. Moves all extremities.  Labs on Admission:  Basic Metabolic Panel:  Recent Labs Lab 11/17/15 2112  NA 126*  K 3.7  CL 87*  CO2 31  GLUCOSE 110*  BUN 28*  CREATININE 0.92  CALCIUM 9.9   Liver Function Tests: No results for input(s): AST, ALT, ALKPHOS, BILITOT, PROT, ALBUMIN in the last 168 hours. No results for input(s): LIPASE, AMYLASE in the last 168 hours. No results for input(s): AMMONIA in the last 168 hours. CBC:  Recent Labs Lab 11/17/15 2112  WBC 24.9*  NEUTROABS 22.7*  HGB 12.1  HCT 36.8  MCV 76.0*  PLT 263   Cardiac Enzymes: No results for input(s): CKTOTAL, CKMB, CKMBINDEX, TROPONINI in the last 168 hours.  BNP (last 3 results)  Recent Labs  10/31/15 1927 11/09/15 0018 11/17/15 2112  BNP 197.6* 232.1* 370.3*    ProBNP (last 3 results) No results for input(s): PROBNP in the last 8760 hours.  CBG: No results for input(s): GLUCAP in the last 168 hours.  Radiological Exams on Admission: Dg Chest Port 1 View  11/17/2015  CLINICAL DATA:  81 year old female with shortness of breath EXAM: PORTABLE CHEST 1 VIEW COMPARISON:  Chest radiograph dated 11/09/2015 FINDINGS: Single-view of the chest demonstrate emphysematous changes of the lungs. There is an area of increased density at the left mid and lower lung field which appears slightly improved compared to the prior study. Small left pleural effusion may be present. The  cardiac silhouette is within normal limits. The osseous structures are grossly unremarkable. IMPRESSION: Emphysema with partial interval improvement of the previously seen left mid and lower lung airspace density. Electronically Signed   By: Elgie Collard M.D.   On: 11/17/2015 22:54    EKG: Independently reviewed. Normal sinus rhythm low voltage.  Assessment/Plan Principal Problem:   SOB (shortness of breath) Active Problems:   Elevated troponin   NSTEMI (non-ST elevated myocardial infarction) (HCC)   Diarrhea   Hyponatremia   1. Shortness of breath - cause not clear. Patient does not look like patient is in CHF. Patient actually received 1 L normal saline bolus in the ER. Since patient shortness of breath was sudden in onset I have ordered a d-dimer and if positive will get CT angiogram of the chest to rule out PE. 2. Diarrhea with leukocytosis with recent use of antibiotics -  strongly suspicious of C. difficile colitis. C. difficile PCR has been ordered. For now since patient has significant leukocytosis blood cultures were ordered and I have placed patient on IV Flagyl until results are available. If patient is C. difficile positive patient probably be need to be switched to by mouth vancomycin. Patient chest x-ray shows improving infiltrate but patient has no signs of pneumonia. 3. Elevated troponin - this is chronically elevated. We will cycle cardiac markers. Aspirin. 4. Chronic hyponatremia - follow metabolic panel closely. 5. Hypertension - continue lisinopril.  I have reviewed patient's old charts and labs. Personally reviewed patient's chest x-ray and EKG. Palliative care has been consulted.   DVT Prophylaxis Lovenox.  Code Status: DO NOT RESUSCITATE.  Family Communication: Discussed with patient.  Disposition Plan: Admit to inpatient.    Alaysia Lightle N. [facility] Pager [phone].  If 7PM-7AM, please contact night-coverage [email or web address] Password  [email or web address] 11/18/2015, 4:10 AM

## 2015-11-18 NOTE — Progress Notes (Signed)
ANTICOAGULATION CONSULT NOTE - Initial Consult  Pharmacy Consult for Xarelto Indication: pulmonary embolus  Allergies  Allergen Reactions   Sulfa Antibiotics Nausea And Vomiting   Latex Hives and Rash   Penicillins Itching and Rash    Has patient had a PCN reaction causing immediate rash, facial/tongue/throat swelling, SOB or lightheadedness with hypotension: {Yes Has patient had a PCN reaction causing severe rash involving mucus membranes or skin necrosis:NO Has patient had a PCN reaction that equired hospitalizationNO Has patient had a PCN reaction occurring within the last 10 years: NO If all of the above answers are "NO", then may proceed with Cephalosporin use.    Patient Measurements: Height: 5\' 1"  (154.9 cm) Weight: 107 lb 8 oz (48.762 kg) IBW/kg (Calculated) : 47.8  Vital Signs: Temp: 97.9 F (36.6 C) (12/02 0407) Temp Source: Oral (12/02 0407) BP: 116/65 mmHg (12/02 1339) Pulse Rate: 79 (12/02 1339)  Labs:  Recent Labs  11/17/15 2112 11/18/15 0537 11/18/15 1104  HGB 12.1 11.2*  --   HCT 36.8 33.6*  --   PLT 263 259  --   CREATININE 0.92 0.74  --   TROPONINI  --  0.15* 0.17*    Estimated Creatinine Clearance: 37.4 mL/min (by C-G formula based on Cr of 0.74).   Medical History: Past Medical History  Diagnosis Date   Hypertension     Medications:  Prescriptions prior to admission  Medication Sig Dispense Refill Last Dose   amLODipine (NORVASC) 10 MG tablet Take 10 mg by mouth daily.   11/17/2015 at Unknown time   aspirin 325 MG tablet Take 325 mg by mouth daily.   11/17/2015 at Unknown time   CALCIUM PO Take 1 tablet by mouth daily.   11/17/2015 at Unknown time   cefUROXime (CEFTIN) 250 MG tablet Take 1 tablet (250 mg total) by mouth 2 (two) times daily with a meal. 6 tablet 0 11/17/2015 at Unknown time   cholecalciferol (VITAMIN D) 1000 UNITS tablet Take 2,000 Units by mouth daily.   11/17/2015 at Unknown time   Cyanocobalamin (VITAMIN B 12 PO)  Take 1 tablet by mouth daily.   11/17/2015 at Unknown time   furosemide (LASIX) 40 MG tablet Take 1 tablet (40 mg total) by mouth daily. 30 tablet 0 11/17/2015 at Unknown time   lisinopril (PRINIVIL,ZESTRIL) 2.5 MG tablet Take 1 tablet (2.5 mg total) by mouth daily. 30 tablet 0 11/17/2015 at Unknown time   Multiple Vitamins-Minerals (EYE VITAMINS) CAPS Take 1 capsule by mouth daily.   11/17/2015 at Unknown time   Polyethyl Glycol-Propyl Glycol (SYSTANE OP) Apply 1 drop to eye daily. For both eyes   11/17/2015 at Unknown time    Assessment: 81 yo F presented with sudden onset of SOB and weakness with reported diarrhea (up to 5x day).  CT confirmed PE and patient is to be started on Xarelto.  CrCl calculated using TBW is >430ml/min.  Xarelto is appropriate.  Of note, pt also has c.diff colitis and was started on PO Vancomycin.  Goal of Therapy:  Therapeutic Anticoagulation Monitor platelets by anticoagulation protocol: Yes   Plan:  Xarelto 15mg  PO bid x 21 days Then Xarelto 20mg  daily with dinner. Xarelto education.  Toys 'R' UsKimberly Demani Mcbrien, Pharm.D., BCPS Clinical Pharmacist Pager [phone] 11/18/2015 2:36 PM

## 2015-11-18 NOTE — Progress Notes (Signed)
Palliative consult received. Patient is established DNR. Noted recent discharge and readmission issue with medications especially failure to obtain her Lasix. Palliative team will see her on 12/2 to discuss goals of care and assist with care coordination. [facility] and our team will assist with a referral to [facility].   Anderson MaltaElizabeth Kiasia Chou, DO Palliative Medicine

## 2015-11-18 NOTE — Care Management Note (Deleted)
Case Management Note  Patient Details  Name: [name] MRN: [id number] Date of Birth: 24-Oct-1926  Subjective/Objective:    Principal Problem Sepsis: Secondary to C. difficile colitis. Started on oral vancomycin. Blood cultures pending. Active Problems:C. difficile colitis: Continue vancomycin SOB : Probably secondary to above. CT scan chest positive for PE-start Xarelto.                 Action/Plan: CM has benefits check in process for Xarelto, Will make pt aware of cost once completed. 30 day free card to be provided to pt before d/c. CM did [place] [facility] [place] and medication is available. Pt will need Rx for 30 day free. No further needs from CM at this time.    Expected Discharge Date:                  Expected Discharge Plan:  Home/Self Care  In-House Referral:  NA  Discharge planning Services  CM Consult, Medication Assistance  Post Acute Care Choice:  NA Choice offered to:  NA  DME Arranged:  N/A DME Agency:  NA  HH Arranged:  NA HH Agency:  NA  Status of Service:  Completed, signed off  Medicare Important Message Given:    Date Medicare IM Given:    Medicare IM give by:    Date Additional Medicare IM Given:    Additional Medicare Important Message give by:     If discussed at Long Length of Stay Meetings, dates discussed:    Additional Comments:  Gala LewandowskyGraves-Bigelow, [place] Vantine Kaye, RN 11/18/2015, 2:56 PM

## 2015-11-19 ENCOUNTER — Inpatient Hospital Stay (HOSPITAL_COMMUNITY): Payer: Medicare Other

## 2015-11-19 DIAGNOSIS — R7989 Other specified abnormal findings of blood chemistry: Secondary | ICD-10-CM

## 2015-11-19 DIAGNOSIS — A419 Sepsis, unspecified organism: Secondary | ICD-10-CM

## 2015-11-19 DIAGNOSIS — E871 Hypo-osmolality and hyponatremia: Secondary | ICD-10-CM

## 2015-11-19 DIAGNOSIS — R609 Edema, unspecified: Secondary | ICD-10-CM

## 2015-11-19 DIAGNOSIS — A047 Enterocolitis due to Clostridium difficile: Secondary | ICD-10-CM

## 2015-11-19 LAB — CBC
HCT: 36.3 % (ref 36.0–46.0)
HEMOGLOBIN: 11.8 g/dL — AB (ref 12.0–15.0)
MCH: 25.2 pg — ABNORMAL LOW (ref 26.0–34.0)
MCHC: 32.5 g/dL (ref 30.0–36.0)
MCV: 77.4 fL — ABNORMAL LOW (ref 78.0–100.0)
Platelets: 272 10*3/uL (ref 150–400)
RBC: 4.69 MIL/uL (ref 3.87–5.11)
RDW: 13.9 % (ref 11.5–15.5)
WBC: 31.5 10*3/uL — AB (ref 4.0–10.5)

## 2015-11-19 LAB — BASIC METABOLIC PANEL
ANION GAP: 7 (ref 5–15)
BUN: 24 mg/dL — ABNORMAL HIGH (ref 6–20)
CHLORIDE: 93 mmol/L — AB (ref 101–111)
CO2: 28 mmol/L (ref 22–32)
Calcium: 9.9 mg/dL (ref 8.9–10.3)
Creatinine, Ser: 0.84 mg/dL (ref 0.44–1.00)
GFR calc non Af Amer: >60 mL/min (ref >60)
Glucose, Bld: 91 mg/dL (ref 65–99)
POTASSIUM: 4.3 mmol/L (ref 3.5–5.1)
SODIUM: 128 mmol/L — AB (ref 135–145)

## 2015-11-19 NOTE — Evaluation (Signed)
Physical Therapy Evaluation Patient Details Name: Anna Gonzales MRN: [id number] DOB: 1926/03/04 Today's Date: 11/19/2015   History of Present Illness  Anna Gonzales is a 81 y/o F admitted 2/2 SOB and weakness and multiple episodes of diarrhea.  Recently admitted for pyelonephritis and CHF.  Pt fell OOB while at hospital on 12/3 w/o injury.  Pt's PMH includes ORIF distal radius fx.    Clinical Impression  Pt admitted with above diagnosis. Pt currently with functional limitations due to the deficits listed below (see PT Problem List). Anna Gonzales is from home w/ husband who has dementia.  Pt will either need HH aide or SNF at D/C for 24/7 assist/supervision as family will not be able to care for pt and pt's husband simultaneously. Min A for sit<>stand and ambulating w/ RW this session.  Pt will benefit from skilled PT to increase their independence and safety with mobility to allow discharge to the venue listed below.      Follow Up Recommendations SNF;Supervision/Assistance - 24 hour    Equipment Recommendations  None recommended by PT    Recommendations for Other Services       Precautions / Restrictions Precautions Precautions: Fall Precaution Comments: fell OOB while at hospital on 12/3 while sleeping (per pt) Restrictions Weight Bearing Restrictions: No      Mobility  Bed Mobility Overal bed mobility: Needs Assistance Bed Mobility: Supine to Sit     Supine to sit: Min guard     General bed mobility comments: Pt uses bed rails w/ increased time  Transfers Overall transfer level: Needs assistance Equipment used: Rolling walker (2 wheeled) Transfers: Sit to/from Stand Sit to Stand: Min assist         General transfer comment: Min A to power up to standing and cues provided for proper technique and hand placement  Ambulation/Gait Ambulation/Gait assistance: Min assist Ambulation Distance (Feet): 80 Feet Assistive device: Rolling walker (2 wheeled) Gait  Pattern/deviations: Step-through pattern;Antalgic;Drifts right/left;Trunk flexed;Decreased stride length   Gait velocity interpretation: <1.8 ft/sec, indicative of risk for recurrent falls General Gait Details: Pt drifts Rt and requires verbal cues to ambulate in miidle of hallway.  Flexed posture w/ cues to stand upright.  Stairs            Wheelchair Mobility    Modified Rankin (Stroke Patients Only)       Balance Overall balance assessment: Needs assistance Sitting-balance support: Bilateral upper extremity supported;Feet supported Sitting balance-Leahy Scale: Good     Standing balance support: Bilateral upper extremity supported;During functional activity Standing balance-Leahy Scale: Poor Standing balance comment: Relies on RW for support                             Pertinent Vitals/Pain Pain Assessment: No/denies pain    Home Living Family/patient expects to be discharged to:: Private residence Living Arrangements: Spouse/significant other;Other relatives Available Help at Discharge: Family;Available 24 hours/day Type of Home: House Home Access: Stairs to enter Entrance Stairs-Rails: Can reach both;Left;Right Entrance Stairs-Number of Steps: 2 Home Layout: One level Home Equipment: Walker - 2 wheels;Wheelchair - manual;Bedside commode;Cane - single point      Prior Function Level of Independence: Needs assistance   Gait / Transfers Assistance Needed: Ind w/o AD  ADL's / Homemaking Assistance Needed: family does cooking and cleaning and assists w/ looking after pt's husband who has dementia (24/7 supervision)        Hand Dominance  Extremity/Trunk Assessment   Upper Extremity Assessment: Generalized weakness           Lower Extremity Assessment: Generalized weakness      Cervical / Trunk Assessment: Kyphotic  Communication   Communication: HOH  Cognition Arousal/Alertness: Awake/alert Behavior During Therapy: WFL for  tasks assessed/performed         Memory: Decreased short-term memory              General Comments      Exercises General Exercises - Lower Extremity Ankle Circles/Pumps: AROM;Both;10 reps;Seated Long Arc Quad: AROM;Both;10 reps;Seated Hip Flexion/Marching: AROM;Both;10 reps;Seated      Assessment/Plan    PT Assessment Patient needs continued PT services  PT Diagnosis Difficulty walking;Generalized weakness   PT Problem List Decreased strength;Decreased range of motion;Decreased activity tolerance;Decreased balance;Decreased mobility;Decreased knowledge of use of DME;Decreased safety awareness;Decreased knowledge of precautions;Cardiopulmonary status limiting activity  PT Treatment Interventions     PT Goals (Current goals can be found in the Care Plan section) Acute Rehab PT Goals Patient Stated Goal: to feel better PT Goal Formulation: With patient/family Time For Goal Achievement: 12/10/15 Potential to Achieve Goals: Good    Frequency Min 2X/week   Barriers to discharge Inaccessible home environment;Decreased caregiver support Pt will either need HH aide or SNF at D/C as family will not be able to care for pt and pt's husband simultaneously. Niece and sister in room during evaluation.    Co-evaluation               End of Session Equipment Utilized During Treatment: Gait belt;Oxygen Activity Tolerance: Patient limited by fatigue Patient left: in chair;with call bell/phone within reach;with chair alarm set;with family/visitor present;with nursing/sitter in room Nurse Communication: Mobility status         Time: 1009-1046 (pt not charged for 10 mins sitting on BSC) PT Time Calculation (min) (ACUTE ONLY): 37 min   Charges:   PT Evaluation $Initial PT Evaluation Tier I: 1 Procedure PT Treatments $Gait Training: 8-22 mins   PT G CodesMichail Jewels:       [name] PT, DPT [phone] Pager: [phone] 11/19/2015, 12:19 PM

## 2015-11-19 NOTE — Progress Notes (Signed)
PATIENT DETAILS Name: KYNLIE JANE Age: 81 y.o. Sex: female Date of Birth: 06-30-1926 Admit Date: 11/17/2015 Admitting Physician Eduard Clos, MD [id number],HYQ Maisie Fus, MD  Subjective: Diarrhea continues-but has slowed down somewhat. No longer short of breath  Assessment/Plan: Principal Problem: Sepsis: Secondary to C. difficile colitis. Continue oral vancomycin. Although has worsening leukocytosis, looks nontoxic and looks rather well than her numbers. Blood cultures pending. Follow clinical course  Active Problems: C. difficile colitis: Continue vancomycin, follow clinical course  Acute hypoxic respiratory failure: Secondary to pulmonary embolism, on anticoagulation. Much improved  Pulmonary embolism: CT angiogram chest done after patient complained of shortness of breathon admission, positive for small pulmonary embolism-started on anticoagulation with Xarelto. Lower extremity Doppler pending.  Mechanical fall: Occurred 12/3 AM-patient slid out of her bed and landed on her left lower extremity. No head trauma. No swelling of her left lower extremity, able to bear weight on both legs. Monitor and follow closely.  Elevated troponin: Mildly elevated troponin-trend is flat and not consistent with ACS-suspect demand ischemia from sepsis. Recent echocardiogram on 11/09/15 EF preserved without any wall motion abnormality, recent nuclear stress test on 11/25 negative for ischemia. EKG negative for acute abnormalities. Denies chest pain.  Hyponatremia: Has chronic hyponatremia baseline-suspect related to volume loss from diarrhea. Appears euvolemic, follow electrolytes.   Hypokalemia: Secondary to diarrhea, repleted, check periodically.  Chronic diastolic heart failure: Compensated, Hold diuretics in the setting of sepsis/diarrhea. Follow volume status, daily weights and strict intake output.  Hypertension: Controlled-continue amlodipine and  lisinopril  Deconditioning/failure to thrive syndrome: Secondary to advanced age with numerous recent illnesses/hospitalization-PT/Nutrition eval-suspect will require SNF on discharge  Disposition: Remain inpatient-SNF early next week if clinical improvement continues  Antimicrobial agents  See below  Anti-infectives    Start     Dose/Rate Route Frequency Ordered Stop   11/18/15 1000  vancomycin (VANCOCIN) 50 mg/mL oral solution 125 mg     125 mg Oral 4 times daily 11/18/15 0830 12/02/15 0959   11/18/15 0415  metroNIDAZOLE (FLAGYL) IVPB 500 mg  Status:  Discontinued     500 mg 100 mL/hr over 60 Minutes Intravenous Every 8 hours 11/18/15 0409 11/18/15 0829      DVT Prophylaxis: Xarelto  Code Status: DNR-confirmed with niece over the phone  Family Communication Niece Ms Lalla Brothers   Procedures: None  CONSULTS:  None  Time spent 25 minutes-Greater than 50% of this time was spent in counseling, explanation of diagnosis, planning of further management, and coordination of care.  MEDICATIONS: Scheduled Meds:  amLODipine  10 mg Oral Daily   aspirin  325 mg Oral Daily   cholecalciferol  2,000 Units Oral Daily   lisinopril  2.5 mg Oral Daily   rivaroxaban  15 mg Oral BID   Followed by   Melene Muller ON 12/09/2015] rivaroxaban  20 mg Oral Q supper   sodium chloride  3 mL Intravenous Q12H   vancomycin  125 mg Oral QID   Continuous Infusions:  PRN Meds:.acetaminophen **OR** acetaminophen, ondansetron **OR** ondansetron (ZOFRAN) IV    PHYSICAL EXAM: Vital signs in last 24 hours: Filed Vitals:   11/18/15 1339 11/18/15 2036 11/19/15 0439 11/19/15 1051  BP: 116/65 115/41 139/55 114/46  Pulse: 79 80 82   Temp:  98.3 F (36.8 C) 97.8 F (36.6 C)   TempSrc:  Oral Oral   Resp: Height:      Weight:  46.63 kg (102 lb 12.8 oz)   SpO2: 98% 98% 98%     Weight change: -0.998 kg (-2 lb 3.2 oz) Filed Weights   11/17/15 1730 11/18/15 0407 11/19/15 0439   Weight: 47.628 kg (105 lb) 48.762 kg (107 lb 8 oz) 46.63 kg (102 lb 12.8 oz)   Body mass index is 19.43 kg/(m^2).   Gen Exam: Awake and alert with clear speech.  Neck: Supple, No JVD.   Chest: B/L Clear.   CVS: S1 S2 Regular, no murmurs.  Abdomen: soft, BS +, non tender, non distended.  Extremities: no edema, lower extremities warm to touch. Neurologic: Non Focal.   Skin: No Rash.   Wounds: N/A.   Intake/Output from previous day:  Intake/Output Summary (Last 24 hours) at 11/19/15 1241 Last data filed at 11/19/15 1041  Gross per 24 hour  Intake    283 ml  Output    800 ml  Net   -517 ml     LAB RESULTS: CBC  Recent Labs Lab 11/17/15 2112 11/18/15 0537 11/19/15 0242  WBC 24.9* 27.2* 31.5*  HGB 12.1 11.2* 11.8*  HCT 36.8 33.6* 36.3  PLT 263 259 272  MCV 76.0* 76.2* 77.4*  MCH 25.0* 25.4* 25.2*  MCHC 32.9 33.3 32.5  RDW 13.4 13.7 13.9  LYMPHSABS 1.1 0.8  --   MONOABS 1.1* 1.4*  --   EOSABS 0.0 0.0  --   BASOSABS 0.0 0.0  --     Chemistries   Recent Labs Lab 11/17/15 2112 11/18/15 0537 11/19/15 0242  NA 126* 128* 128*  K 3.7 3.4* 4.3  CL 87* 90* 93*  CO2 31 31 28   GLUCOSE 110* 101* 91  BUN 28* 24* 24*  CREATININE 0.92 0.74 0.84  CALCIUM 9.9 9.8 9.9    CBG: No results for input(s): GLUCAP in the last 168 hours.  GFR Estimated Creatinine Clearance: 34.7 mL/min (by C-G formula based on Cr of 0.84).  Coagulation profile No results for input(s): INR, PROTIME in the last 168 hours.  Cardiac Enzymes  Recent Labs Lab 11/18/15 0537 11/18/15 1104 11/18/15 1630  TROPONINI 0.15* 0.17* 0.16*    Invalid input(s): POCBNP  Recent Labs  11/18/15 0537  DDIMER 3.31*   No results for input(s): HGBA1C in the last 72 hours. No results for input(s): CHOL, HDL, LDLCALC, TRIG, CHOLHDL, LDLDIRECT in the last 72 hours. No results for input(s): TSH, T4TOTAL, T3FREE, THYROIDAB in the last 72 hours.  Invalid input(s): FREET3 No results for input(s):  VITAMINB12, FOLATE, FERRITIN, TIBC, IRON, RETICCTPCT in the last 72 hours. No results for input(s): LIPASE, AMYLASE in the last 72 hours.  Urine Studies No results for input(s): UHGB, CRYS in the last 72 hours.  Invalid input(s): UACOL, UAPR, USPG, UPH, UTP, UGL, UKET, UBIL, UNIT, UROB, ULEU, UEPI, UWBC, URBC, UBAC, CAST, UCOM, BILUA  MICROBIOLOGY: Recent Results (from the past 240 hour(s))  C difficile quick scan w PCR reflex     Status: Abnormal   Collection Time: 11/18/15  6:36 AM  Result Value Ref Range Status   C Diff antigen POSITIVE (A) NEGATIVE Final   C Diff toxin POSITIVE (A) NEGATIVE Final   C Diff interpretation   Final    CRITICAL RESULT CALLED TO, READ BACK BY AND VERIFIED WITH:    Comment: SPOKE TO RN W. HIX AT 805 ON 11/17/2016 PER K.PEELE    RADIOLOGY STUDIES/RESULTS: Dg Chest 2 View  10/31/2015  CLINICAL DATA:  Shortness of breath and weakness, 4 days  duration. EXAM: CHEST  2 VIEW COMPARISON:  08/09/2015 and previous FINDINGS: Heart size is normal. There is atherosclerosis of the aorta. There is chronic blunting of the posterior costophrenic angles. The lungs show slightly increased chronic interstitial markings but there is no evidence of focal active infiltrate, mass or collapse. There are old rib fractures on the left. No acute bone finding. IMPRESSION: Atherosclerosis of the aorta. Mild chronic interstitial lung markings. No focal or active process otherwise. Chronic blunting of the posterior costophrenic angles presumed secondary to scarring. Electronically Signed   By: Paulina Fusi M.D.   On: 10/31/2015 19:36   Ct Angio Chest Pe W/cm &/or Wo Cm  11/18/2015  ADDENDUM REPORT: 11/18/2015 13:22 ADDENDUM: Critical Value/emergent results were called by telephone at the time of interpretation on 11/18/2015 at 1:20 pm to Dr. Jerral Ralph, who verbally acknowledged these results. Electronically Signed   By: Bary Richard M.D.   On: 11/18/2015 13:22  11/18/2015  CLINICAL DATA:   Increased shortness of breath EXAM: CT ANGIOGRAPHY CHEST WITH CONTRAST TECHNIQUE: Multidetector CT imaging of the chest was performed using the standard protocol during bolus administration of intravenous contrast. Multiplanar CT image reconstructions and MIPs were obtained to evaluate the vascular anatomy. CONTRAST:  80mL OMNIPAQUE IOHEXOL 350 MG/ML SOLN COMPARISON:  Chest x-ray dated 11/17/2015. FINDINGS: Focal nonocclusive pulmonary emboli are present within 2 segmental pulmonary artery branches to the lateral aspects of the right middle lobe, best seen on images 135 through 143 of series 7. No other convincing pulmonary emboli identified within the main, lobar, or central segmental pulmonary arteries bilaterally. Some of the more peripheral segmental and subsegmental pulmonary arteries are difficult to definitively characterize due to inadequate contrast opacification, but I suspect additional small focal pulmonary embolus within a peripheral subsegmental pulmonary artery branch to the posterior-medial right lower lobe. Heart size is upper normal. No pericardial effusion. No evidence of right heart strain. Scattered atherosclerotic changes are seen along the walls of the normal-caliber thoracic aorta. There are small bilateral pleural effusions with adjacent bibasilar atelectasis. There is also mild biapical scarring/fibrosis. Remainder of the lungs are clear. Trachea and central bronchi are unremarkable. There is upper abdominal ascites, seen on limited images of the upper abdomen. Degenerative changes are seen throughout the thoracic spine but no acute osseous abnormality. Old healed rib fractures noted on the left. Review of the MIP images confirms the above findings. IMPRESSION: 1. Focal acute-appearing nonocclusive pulmonary emboli within 2 segmental pulmonary artery branches to the lateral aspects of the right middle lobe. 2. Suspect additional small pulmonary embolus within a peripheral subsegmental  pulmonary artery branch to the posterior-medial right lower lobe, but not convincing. 3. Small bilateral pleural effusions with adjacent bibasilar atelectasis. 4. Upper abdominal ascites, incompletely imaged. 5. Thoracic aorta atherosclerosis.  No thoracic aortic aneurysm. Electronically Signed: By: Bary Richard M.D. On: 11/18/2015 13:14   Nm Myocar Multi W/spect W/wall Motion / Ef  11/11/2015  CLINICAL DATA:  81 year old female with wide-complex tachycardia. EXAM: MYOCARDIAL IMAGING WITH SPECT (REST AND PHARMACOLOGIC-STRESS) GATED LEFT VENTRICULAR WALL MOTION STUDY LEFT VENTRICULAR EJECTION FRACTION TECHNIQUE: Standard myocardial SPECT imaging was performed after resting intravenous injection of 10 mCi Tc-59m sestamibi. Subsequently, intravenous infusion of Lexiscan was performed under the supervision of the Cardiology staff. At peak effect of the drug, 30 mCi Tc-12m sestamibi was injected intravenously and standard myocardial SPECT imaging was performed. Quantitative gated imaging was also performed to evaluate left ventricular wall motion, and estimate left ventricular ejection fraction. COMPARISON:  Chest  radiograph 11/09/2015 FINDINGS: Perfusion: There is no evidence to suggest reversible ischemia. However, there are decreased counts along the inferior wall on both the rest and stress images and this is probably related to increased uptake in the upper abdomen. As a result, limited evaluation of the inferior wall. Wall Motion: Normal left ventricular wall motion. No left ventricular dilation. Left Ventricular Ejection Fraction: 70 % End diastolic volume 51 ml End systolic volume 15 ml IMPRESSION: 1. No evidence for reversible ischemia but limited evaluation of the inferior wall as described. 2. Normal left ventricular wall motion. 3. Left ventricular ejection fraction is 70%. 4. Low-risk stress test findings*. *2012 Appropriate Use Criteria for Coronary Revascularization Focused Update: J Am Coll Cardiol.  2012;59(9):857-881. http://content.dementiazones.com.aspx?articleid=[email or web address] Electronically Signed   By: Richarda Overlie M.D.   On: 11/11/2015 13:50   Dg Chest Port 1 View  11/17/2015  CLINICAL DATA:  81 year old female with shortness of breath EXAM: PORTABLE CHEST 1 VIEW COMPARISON:  Chest radiograph dated 11/09/2015 FINDINGS: Single-view of the chest demonstrate emphysematous changes of the lungs. There is an area of increased density at the left mid and lower lung field which appears slightly improved compared to the prior study. Small left pleural effusion may be present. The cardiac silhouette is within normal limits. The osseous structures are grossly unremarkable. IMPRESSION: Emphysema with partial interval improvement of the previously seen left mid and lower lung airspace density. Electronically Signed   By: Elgie Collard M.D.   On: 11/17/2015 22:54   Dg Chest Port 1 View  11/09/2015  CLINICAL DATA:  Shortness of breath and nausea tonight. EXAM: PORTABLE CHEST 1 VIEW COMPARISON:  10/31/2015 FINDINGS: Mild cardiac enlargement without significant vascular congestion. Emphysematous changes with chronic interstitial fibrosis in the lungs. Increased density in the left lung base may be due to shallow inspiration but infiltration or atelectasis in the left base is not excluded. No pneumothorax. Calcified and tortuous aorta. IMPRESSION: Cardiac enlargement. Emphysematous changes with interstitial fibrosis in the lungs. Possible superimposed infiltration or atelectasis in the left base. Electronically Signed   By: Burman Nieves M.D.   On: 11/09/2015 00:29    Jeoffrey Massed, MD  [facility] Pager:[phone]  If 7PM-7AM, please contact night-coverage [email or web address] Password [email or web address] 11/19/2015, 12:41 PM   LOS: 2 days

## 2015-11-19 NOTE — Progress Notes (Addendum)
Called to patient room at 0748 by charge nurse, Alvis Lemmingsawn, stating that patient had fallen. Patient found on floor by NT, Leodis RainsYolanda Bethea. No apparent injuries to head or skin or patient complaints, except patient stated L foot ankle sore. After several minutes, patient denied continued pain to L foot ankle area. Patient alert and oriented x 4. Telemetry #5 ST. O2 sat94% on 2L/min.  Patient's niece, Sheran Spineudrey Lambert, notified at [phone]. Dr. Jerral RalphGhimire notified, and at bedside to assess patient. Patient education provided to call for assistance. Bed alarm used. Gait belt used. Will continue to monitor. Hortencia ConradiGWALTNEY, Janicia Monterrosa B, RN   This note also relates to the following rows which could not be included: ECG Heart Rate - Cannot attach notes to unvalidated device data

## 2015-11-19 NOTE — Progress Notes (Addendum)
*  Preliminary Results* Bilateral lower extremity venous duplex completed. The right lower extremity is negative for deep vein thrombosis. The left lower extremity is positive for acute deep vein thrombosis involving the left peroneal veins, and acute superficial vein thrombosis involving the origin of the lesser saphenous vein. The proximal lesser saphenous vein exhibits chronic thrombosis.  Preliminary results discussed with Toniann FailWendy, RN.  11/19/2015  Gertie FeyMichelle Camellia Popescu, RVT, RDCS, RDMS

## 2015-11-19 NOTE — Progress Notes (Signed)
Dr. Jerral RalphGhimire paged of L DVT. Will continue to monitor. Bess KindsGWALTNEY, Ladan Vanderzanden B, RN

## 2015-11-20 ENCOUNTER — Inpatient Hospital Stay (HOSPITAL_COMMUNITY): Payer: Medicare Other

## 2015-11-20 LAB — CBC
HCT: 38.1 % (ref 36.0–46.0)
Hemoglobin: 12.1 g/dL (ref 12.0–15.0)
MCH: 24.7 pg — AB (ref 26.0–34.0)
MCHC: 31.8 g/dL (ref 30.0–36.0)
MCV: 77.8 fL — AB (ref 78.0–100.0)
PLATELETS: 342 10*3/uL (ref 150–400)
RBC: 4.9 MIL/uL (ref 3.87–5.11)
RDW: 14.1 % (ref 11.5–15.5)
WBC: 35.8 10*3/uL — ABNORMAL HIGH (ref 4.0–10.5)

## 2015-11-20 LAB — BASIC METABOLIC PANEL
ANION GAP: 7 (ref 5–15)
BUN: 38 mg/dL — ABNORMAL HIGH (ref 6–20)
CALCIUM: 10.5 mg/dL — AB (ref 8.9–10.3)
CHLORIDE: 94 mmol/L — AB (ref 101–111)
CO2: 27 mmol/L (ref 22–32)
CREATININE: 1.24 mg/dL — AB (ref 0.44–1.00)
GFR calc non Af Amer: 38 mL/min — ABNORMAL LOW (ref >60)
GFR, EST AFRICAN AMERICAN: 44 mL/min — AB (ref >60)
Glucose, Bld: 132 mg/dL — ABNORMAL HIGH (ref 65–99)
Potassium: 4.5 mmol/L (ref 3.5–5.1)
SODIUM: 128 mmol/L — AB (ref 135–145)

## 2015-11-20 MED ORDER — SODIUM CHLORIDE 0.9 % IV SOLN
INTRAVENOUS | Status: DC
  Administered 2015-11-20: 09:00:00 via INTRAVENOUS

## 2015-11-20 MED ORDER — METRONIDAZOLE IN NACL 5-0.79 MG/ML-% IV SOLN
500.0000 mg | Freq: Three times a day (TID) | INTRAVENOUS | Status: DC
  Administered 2015-11-20 – 2015-11-23 (×9): 500 mg via INTRAVENOUS
  Filled 2015-11-20 (×11): qty 100

## 2015-11-20 MED ORDER — VANCOMYCIN 50 MG/ML ORAL SOLUTION
250.0000 mg | Freq: Four times a day (QID) | ORAL | Status: DC
  Administered 2015-11-20 – 2015-11-22 (×12): 250 mg via ORAL
  Filled 2015-11-20 (×16): qty 5

## 2015-11-20 NOTE — Progress Notes (Signed)
Attempted to meet with Pt.  Pt was drowsy and stated that she wasn't able to speak with MSW.  She instructed MSW to reach out to her niece, Orlene Ermudrey Lambeth, at [phone].  MSW attempted to speak with Orlene ErmAudrey Lambeth.  MSW left a voicemail with contact information.  Providence CrosbyAmanda Prinston Kynard, LCSW Clinical Social Work [phone]

## 2015-11-20 NOTE — Progress Notes (Signed)
PATIENT DETAILS Name: Anna Gonzales Age: 81 y.o. Sex: female Date of Birth: January 04, 1926 Admit Date: 11/17/2015 Admitting Physician Eduard ClosArshad N Kakrakandy, MD ZOX:[id number],WJXPCP:[id number],HAL Maisie FusHOMAS, MD  Subjective: Diarrhea continues. No longer short of breath-looks very frail and weak  Assessment/Plan: Principal Problem: Sepsis: Secondary to C. difficile colitis. Continues to have worsening leukocytosis and diarrhea, spoke with infectious disease-Dr. Hatcher-recommendations were to start IV Flagyl, will increase oral vancomycin to 250 mg. Obtain a x-ray-has some mild distention of her abdomen. Blood cultures negative. We will continue to follow clinical course, if no further clinical improvement we will consult ID in the next few days.  Active Problems: C. difficile colitis: See above  Acute hypoxic respiratory failure: Secondary to pulmonary embolism, on anticoagulation. Much improved  Pulmonary embolism with left lower extremity DVT: CT angiogram chest done after patient complained of shortness of breathon admission, positive for small pulmonary embolism-started on anticoagulation with Xarelto. Lower extremity Doppler positive for left-sided DVT.   Mechanical fall: Occurred 12/3 AM-patient slid out of her bed and landed on her left lower extremity. No head trauma. No swelling of her left lower extremity, able to bear weight on both legs. Monitor and follow closely.  Elevated troponin: Mildly elevated troponin-trend is flat and not consistent with ACS-suspect demand ischemia from sepsis. Recent echocardiogram on 11/09/15 EF preserved without any wall motion abnormality, recent nuclear stress test on 11/25 negative for ischemia. EKG negative for acute abnormalities. Denies chest pain.  ARF: Secondary to diarrhea, start IV fluids, repeat electrolytes in a.m.  Hyponatremia: Has chronic hyponatremia baseline-suspect related to volume loss from diarrhea. Appears euvolemic, follow  electrolytes.   Hypokalemia: Secondary to diarrhea, repleted, check periodically.  Chronic diastolic heart failure: Compensated, Hold diuretics in the setting of sepsis/diarrhea. Follow volume status, daily weights and strict intake output.  Hypertension: Controlled-continue amlodipine and lisinopril  Deconditioning/failure to thrive syndrome: Secondary to advanced age with numerous recent illnesses/hospitalization-PT/Nutrition eval-SNF on discharge  Disposition: Remain inpatient-SNF early next week if clinical improvement continues  Antimicrobial agents  See below  Anti-infectives    Start     Dose/Rate Route Frequency Ordered Stop   11/20/15 1000  vancomycin (VANCOCIN) 50 mg/mL oral solution 250 mg     250 mg Oral 4 times daily 11/20/15 0953 12/02/15 0959   11/20/15 1000  metroNIDAZOLE (FLAGYL) IVPB 500 mg     500 mg 100 mL/hr over 60 Minutes Intravenous Every 8 hours 11/20/15 0953     11/18/15 1000  vancomycin (VANCOCIN) 50 mg/mL oral solution 125 mg  Status:  Discontinued     125 mg Oral 4 times daily 11/18/15 0830 11/20/15 0953   11/18/15 0415  metroNIDAZOLE (FLAGYL) IVPB 500 mg  Status:  Discontinued     500 mg 100 mL/hr over 60 Minutes Intravenous Every 8 hours 11/18/15 0409 11/18/15 0829      DVT Prophylaxis: Xarelto  Code Status: DNR  Family Communication Niece Ms Lalla BrothersLambert at bedside  Procedures: None  CONSULTS:  None  Time spent 25 minutes-Greater than 50% of this time was spent in counseling, explanation of diagnosis, planning of further management, and coordination of care.  MEDICATIONS: Scheduled Meds:  amLODipine  10 mg Oral Daily   aspirin  325 mg Oral Daily   cholecalciferol  2,000 Units Oral Daily   lisinopril  2.5 mg Oral Daily   metronidazole  500 mg Intravenous Q8H   rivaroxaban  15 mg Oral BID  Followed by   [START ON 12/09/2015] rivaroxaban  20 mg Oral Q supper   sodium chloride  3 mL Intravenous Q12H   vancomycin  250 mg  Oral QID   Continuous Infusions:  sodium chloride 75 mL/hr at 11/20/15 0900   PRN Meds:.acetaminophen **OR** acetaminophen, ondansetron **OR** ondansetron (ZOFRAN) IV    PHYSICAL EXAM: Vital signs in last 24 hours: Filed Vitals:   11/19/15 2145 11/20/15 0303 11/20/15 0500 11/20/15 1029  BP:    108/46  Pulse:      Temp: 97.4 F (36.3 C) 97.8 F (36.6 C)    TempSrc: Oral Oral    Resp:      Height:      Weight:   48.626 kg (107 lb 3.2 oz)   SpO2:        Weight change: 1.996 kg (4 lb 6.4 oz) Filed Weights   11/18/15 0407 11/19/15 0439 11/20/15 0500  Weight: 48.762 kg (107 lb 8 oz) 46.63 kg (102 lb 12.8 oz) 48.626 kg (107 lb 3.2 oz)   Body mass index is 20.27 kg/(m^2).   Gen Exam: Awake and alert with clear speech.  Neck: Supple, No JVD.   Chest: B/L Clear.   CVS: S1 S2 Regular, no murmurs.  Abdomen: soft, BS +, non tender, mildly distended.  Extremities: no edema, lower extremities warm to touch. Neurologic: Non Focal.   Skin: No Rash.   Wounds: N/A.   Intake/Output from previous day:  Intake/Output Summary (Last 24 hours) at 11/20/15 1154 Last data filed at 11/19/15 1700  Gross per 24 hour  Intake    170 ml  Output      0 ml  Net    170 ml     LAB RESULTS: CBC  Recent Labs Lab 11/17/15 2112 11/18/15 0537 11/19/15 0242 11/20/15 0615  WBC 24.9* 27.2* 31.5* 35.8*  HGB 12.1 11.2* 11.8* 12.1  HCT 36.8 33.6* 36.3 38.1  PLT 263 259 272 342  MCV 76.0* 76.2* 77.4* 77.8*  MCH 25.0* 25.4* 25.2* 24.7*  MCHC 32.9 33.3 32.5 31.8  RDW 13.4 13.7 13.9 14.1  LYMPHSABS 1.1 0.8  --   --   MONOABS 1.1* 1.4*  --   --   EOSABS 0.0 0.0  --   --   BASOSABS 0.0 0.0  --   --     Chemistries   Recent Labs Lab 11/17/15 2112 11/18/15 0537 11/19/15 0242 11/20/15 0615  NA 126* 128* 128* 128*  K 3.7 3.4* 4.3 4.5  CL 87* 90* 93* 94*  CO2 GLUCOSE 110* 101* 91 132*  BUN 28* 24* 24* 38*  CREATININE 0.92 0.74 0.84 1.24*  CALCIUM 9.9 9.8 9.9 10.5*     CBG: No results for input(s): GLUCAP in the last 168 hours.  GFR Estimated Creatinine Clearance: 24.1 mL/min (by C-G formula based on Cr of 1.24).  Coagulation profile No results for input(s): INR, PROTIME in the last 168 hours.  Cardiac Enzymes  Recent Labs Lab 11/18/15 0537 11/18/15 1104 11/18/15 1630  TROPONINI 0.15* 0.17* 0.16*    Invalid input(s): POCBNP  Recent Labs  11/18/15 0537  DDIMER 3.31*   No results for input(s): HGBA1C in the last 72 hours. No results for input(s): CHOL, HDL, LDLCALC, TRIG, CHOLHDL, LDLDIRECT in the last 72 hours. No results for input(s): TSH, T4TOTAL, T3FREE, THYROIDAB in the last 72 hours.  Invalid input(s): FREET3 No results for input(s): VITAMINB12, FOLATE, FERRITIN, TIBC, IRON, RETICCTPCT in the last 72  hours. No results for input(s): LIPASE, AMYLASE in the last 72 hours.  Urine Studies No results for input(s): UHGB, CRYS in the last 72 hours.  Invalid input(s): UACOL, UAPR, USPG, UPH, UTP, UGL, UKET, UBIL, UNIT, UROB, ULEU, UEPI, UWBC, URBC, UBAC, CAST, UCOM, BILUA  MICROBIOLOGY: Recent Results (from the past 240 hour(s))  Blood culture (routine x 2)     Status: None (Preliminary result)   Collection Time: 11/17/15 11:49 PM  Result Value Ref Range Status   Specimen Description BLOOD RIGHT HAND  Final   Special Requests BOTTLES DRAWN AEROBIC AND ANAEROBIC 5CC  Final   Culture NO GROWTH 1 DAY  Final   Report Status PENDING  Incomplete  Blood culture (routine x 2)     Status: None (Preliminary result)   Collection Time: 11/17/15 11:53 PM  Result Value Ref Range Status   Specimen Description BLOOD RIGHT WRIST  Final   Special Requests BOTTLES DRAWN AEROBIC AND ANAEROBIC 5CC  Final   Culture NO GROWTH 1 DAY  Final   Report Status PENDING  Incomplete  C difficile quick scan w PCR reflex     Status: Abnormal   Collection Time: 11/18/15  6:36 AM  Result Value Ref Range Status   C Diff antigen POSITIVE (A) NEGATIVE Final    C Diff toxin POSITIVE (A) NEGATIVE Final   C Diff interpretation   Final    CRITICAL RESULT CALLED TO, READ BACK BY AND VERIFIED WITH:    Comment: SPOKE TO RN W. HIX AT 805 ON 11/17/2016 PER K.PEELE    RADIOLOGY STUDIES/RESULTS: Dg Chest 2 View  10/31/2015  CLINICAL DATA:  Shortness of breath and weakness, 4 days duration. EXAM: CHEST  2 VIEW COMPARISON:  08/09/2015 and previous FINDINGS: Heart size is normal. There is atherosclerosis of the aorta. There is chronic blunting of the posterior costophrenic angles. The lungs show slightly increased chronic interstitial markings but there is no evidence of focal active infiltrate, mass or collapse. There are old rib fractures on the left. No acute bone finding. IMPRESSION: Atherosclerosis of the aorta. Mild chronic interstitial lung markings. No focal or active process otherwise. Chronic blunting of the posterior costophrenic angles presumed secondary to scarring. Electronically Signed   By: Paulina Fusi M.D.   On: 10/31/2015 19:36   Ct Angio Chest Pe W/cm &/or Wo Cm  11/18/2015  ADDENDUM REPORT: 11/18/2015 13:22 ADDENDUM: Critical Value/emergent results were called by telephone at the time of interpretation on 11/18/2015 at 1:20 pm to Dr. Jerral Ralph, who verbally acknowledged these results. Electronically Signed   By: Bary Richard M.D.   On: 11/18/2015 13:22  11/18/2015  CLINICAL DATA:  Increased shortness of breath EXAM: CT ANGIOGRAPHY CHEST WITH CONTRAST TECHNIQUE: Multidetector CT imaging of the chest was performed using the standard protocol during bolus administration of intravenous contrast. Multiplanar CT image reconstructions and MIPs were obtained to evaluate the vascular anatomy. CONTRAST:  80mL OMNIPAQUE IOHEXOL 350 MG/ML SOLN COMPARISON:  Chest x-ray dated 11/17/2015. FINDINGS: Focal nonocclusive pulmonary emboli are present within 2 segmental pulmonary artery branches to the lateral aspects of the right middle lobe, best seen on images 135 through  143 of series 7. No other convincing pulmonary emboli identified within the main, lobar, or central segmental pulmonary arteries bilaterally. Some of the more peripheral segmental and subsegmental pulmonary arteries are difficult to definitively characterize due to inadequate contrast opacification, but I suspect additional small focal pulmonary embolus within a peripheral subsegmental pulmonary artery branch to the posterior-medial right  lower lobe. Heart size is upper normal. No pericardial effusion. No evidence of right heart strain. Scattered atherosclerotic changes are seen along the walls of the normal-caliber thoracic aorta. There are small bilateral pleural effusions with adjacent bibasilar atelectasis. There is also mild biapical scarring/fibrosis. Remainder of the lungs are clear. Trachea and central bronchi are unremarkable. There is upper abdominal ascites, seen on limited images of the upper abdomen. Degenerative changes are seen throughout the thoracic spine but no acute osseous abnormality. Old healed rib fractures noted on the left. Review of the MIP images confirms the above findings. IMPRESSION: 1. Focal acute-appearing nonocclusive pulmonary emboli within 2 segmental pulmonary artery branches to the lateral aspects of the right middle lobe. 2. Suspect additional small pulmonary embolus within a peripheral subsegmental pulmonary artery branch to the posterior-medial right lower lobe, but not convincing. 3. Small bilateral pleural effusions with adjacent bibasilar atelectasis. 4. Upper abdominal ascites, incompletely imaged. 5. Thoracic aorta atherosclerosis.  No thoracic aortic aneurysm. Electronically Signed: By: Bary Richard M.D. On: 11/18/2015 13:14   Nm Myocar Multi W/spect W/wall Motion / Ef  11/11/2015  CLINICAL DATA:  81 year old female with wide-complex tachycardia. EXAM: MYOCARDIAL IMAGING WITH SPECT (REST AND PHARMACOLOGIC-STRESS) GATED LEFT VENTRICULAR WALL MOTION STUDY LEFT  VENTRICULAR EJECTION FRACTION TECHNIQUE: Standard myocardial SPECT imaging was performed after resting intravenous injection of 10 mCi Tc-39m sestamibi. Subsequently, intravenous infusion of Lexiscan was performed under the supervision of the Cardiology staff. At peak effect of the drug, 30 mCi Tc-46m sestamibi was injected intravenously and standard myocardial SPECT imaging was performed. Quantitative gated imaging was also performed to evaluate left ventricular wall motion, and estimate left ventricular ejection fraction. COMPARISON:  Chest radiograph 11/09/2015 FINDINGS: Perfusion: There is no evidence to suggest reversible ischemia. However, there are decreased counts along the inferior wall on both the rest and stress images and this is probably related to increased uptake in the upper abdomen. As a result, limited evaluation of the inferior wall. Wall Motion: Normal left ventricular wall motion. No left ventricular dilation. Left Ventricular Ejection Fraction: 70 % End diastolic volume 51 ml End systolic volume 15 ml IMPRESSION: 1. No evidence for reversible ischemia but limited evaluation of the inferior wall as described. 2. Normal left ventricular wall motion. 3. Left ventricular ejection fraction is 70%. 4. Low-risk stress test findings*. *2012 Appropriate Use Criteria for Coronary Revascularization Focused Update: J Am Coll Cardiol. 2012;59(9):857-881. http://content.dementiazones.com.aspx?articleid=[email or web address] Electronically Signed   By: Richarda Overlie M.D.   On: 11/11/2015 13:50   Dg Chest Port 1 View  11/17/2015  CLINICAL DATA:  81 year old female with shortness of breath EXAM: PORTABLE CHEST 1 VIEW COMPARISON:  Chest radiograph dated 11/09/2015 FINDINGS: Single-view of the chest demonstrate emphysematous changes of the lungs. There is an area of increased density at the left mid and lower lung field which appears slightly improved compared to the prior study. Small left pleural effusion may be  present. The cardiac silhouette is within normal limits. The osseous structures are grossly unremarkable. IMPRESSION: Emphysema with partial interval improvement of the previously seen left mid and lower lung airspace density. Electronically Signed   By: Elgie Collard M.D.   On: 11/17/2015 22:54   Dg Chest Port 1 View  11/09/2015  CLINICAL DATA:  Shortness of breath and nausea tonight. EXAM: PORTABLE CHEST 1 VIEW COMPARISON:  10/31/2015 FINDINGS: Mild cardiac enlargement without significant vascular congestion. Emphysematous changes with chronic interstitial fibrosis in the lungs. Increased density in the left lung base may be due to  shallow inspiration but infiltration or atelectasis in the left base is not excluded. No pneumothorax. Calcified and tortuous aorta. IMPRESSION: Cardiac enlargement. Emphysematous changes with interstitial fibrosis in the lungs. Possible superimposed infiltration or atelectasis in the left base. Electronically Signed   By: Burman Nieves M.D.   On: 11/09/2015 00:29    Jeoffrey Massed, MD  [facility] Pager:[phone]  If 7PM-7AM, please contact night-coverage [email or web address] Password [email or web address] 11/20/2015, 11:54 AM   LOS: 3 days

## 2015-11-20 NOTE — NC FL2 (Signed)
[place] MEDICAID FL2 LEVEL OF CARE SCREENING TOOL     IDENTIFICATION  Patient Name: Anna Gonzales Birthdate: 06/02/1926 Sex: female Admission Date (Current Location): 11/17/2015  [place]   Facility and Address:  [facility], [address], [place], [place] [address]      Provider Number: [id number]  Attending Physician Name and Address:  Maretta BeesShanker M Ghimire, MD  Relative Name and Phone Number:       Current Level of Care: SNF Recommended Level of Care: Skilled Nursing Facility Prior Approval Number:    Date Approved/Denied:   PASRR Number: [id number]  Discharge Plan: SNF    Current Diagnoses: Patient Active Problem List   Diagnosis Date Noted  . SOB (shortness of breath) 11/18/2015  . Diarrhea 11/18/2015  . Hyponatremia 11/18/2015  . NSTEMI (non-ST elevated myocardial infarction) (HCC) 11/17/2015  . Acute diastolic heart failure (HCC)   . Shortness of breath 11/09/2015  . Acute CHF (congestive heart failure) (HCC) 11/09/2015  . UTI (lower urinary tract infection) 11/09/2015  . Elevated troponin 11/09/2015  . Gastroenteritis and colitis, viral 08/10/2015  . Chest pain 08/09/2015  . Dehydration 08/09/2015  . Acute kidney injury (HCC) 08/09/2015  . Essential hypertension 08/09/2015    Orientation ACTIVITIES/SOCIAL BLADDER RESPIRATION    Self, Time, Situation, Place  Active Continent Other (Comment) (2L of O2)  BEHAVIORAL SYMPTOMS/MOOD NEUROLOGICAL BOWEL NUTRITION STATUS      Continent  (heart healthy)  PHYSICIAN VISITS COMMUNICATION OF NEEDS Height & Weight Skin    Verbally   107 lbs. Normal          AMBULATORY STATUS RESPIRATION    Assist extensive Other (Comment) (2L of O2)      Personal Care Assistance Level of Assistance  Bathing, Dressing Bathing Assistance: Limited assistance   Dressing Assistance: Limited assistance      Functional Limitations Info                SPECIAL  CARE FACTORS FREQUENCY                      Additional Factors Info  Code Status, Allergies Code Status Info: DNR Allergies Info: sulfa antibiotics, latex and penicillian           Current Medications (11/20/2015):  This is the current hospital active medication list Current Facility-Administered Medications  Medication Dose Route Frequency Provider Last Rate Last Dose  . 0.9 %  sodium chloride infusion   Intravenous Continuous Shanker Levora DredgeM Ghimire, MD      . acetaminophen (TYLENOL) tablet 650 mg  650 mg Oral Q6H PRN Eduard ClosArshad N Kakrakandy, MD       Or  . acetaminophen (TYLENOL) suppository 650 mg  650 mg Rectal Q6H PRN Eduard ClosArshad N Kakrakandy, MD      . amLODipine (NORVASC) tablet 10 mg  10 mg Oral Daily Maretta BeesShanker M Ghimire, MD   10 mg at 11/19/15 1041  . aspirin tablet 325 mg  325 mg Oral Daily Eduard ClosArshad N Kakrakandy, MD   325 mg at 11/19/15 1041  . cholecalciferol (VITAMIN D) tablet 2,000 Units  2,000 Units Oral Daily Eduard ClosArshad N Kakrakandy, MD   2,000 Units at 11/19/15 1041  . lisinopril (PRINIVIL,ZESTRIL) tablet 2.5 mg  2.5 mg Oral Daily Maretta BeesShanker M Ghimire, MD   2.5 mg at 11/19/15 1041  . metroNIDAZOLE (FLAGYL) IVPB 500 mg  500 mg Intravenous Q8H Shanker Levora DredgeM Ghimire, MD      .  ondansetron (ZOFRAN) tablet 4 mg  4 mg Oral Q6H PRN Eduard Clos, MD       Or  . ondansetron [facility]) injection 4 mg  4 mg Intravenous Q6H PRN Eduard Clos, MD      . Rivaroxaban (XARELTO) tablet 15 mg  15 mg Oral BID Gerhard Munch Hammons, RPH   15 mg at 11/19/15 2217   Followed by  . [START ON 12/09/2015] rivaroxaban (XARELTO) tablet 20 mg  20 mg Oral Q supper Kimberly B Hammons, RPH      . sodium chloride 0.9 % injection 3 mL  3 mL Intravenous Q12H Eduard Clos, MD   3 mL at 11/19/15 2200  . vancomycin (VANCOCIN) 50 mg/mL oral solution 250 mg  250 mg Oral QID Maretta Bees, MD         Discharge Medications: Please see discharge summary for a list of discharge medications.  Relevant Imaging  Results:  Relevant Lab Results:  Recent Labs    Additional Information    Lebron Quam, LCSW

## 2015-11-20 NOTE — [place] FL2 (Signed)
[place] MEDICAID FL2 LEVEL OF CARE SCREENING TOOL     IDENTIFICATION  Patient Name: Anna Gonzales Birthdate: 02-Apr-1926 Sex: female Admission Date (Current Location): 11/17/2015  [facility] and [place] Number: Producer, television/film/video and Address:  [facility], [address], [place], [place] [address]      Provider Number: [id number]  Attending Physician Name and Address:  Maretta Bees, MD  Relative Name and Phone Number:       Current Level of Care: SNF Recommended Level of Care: Skilled Nursing Facility Prior Approval Number:    Date Approved/Denied:   PASRR Number: [id number]  Discharge Plan: SNF    Current Diagnoses: Patient Active Problem List   Diagnosis Date Noted   SOB (shortness of breath) 11/18/2015   Diarrhea 11/18/2015   Hyponatremia 11/18/2015   NSTEMI (non-ST elevated myocardial infarction) (HCC) 11/17/2015   Acute diastolic heart failure (HCC)    Shortness of breath 11/09/2015   Acute CHF (congestive heart failure) (HCC) 11/09/2015   UTI (lower urinary tract infection) 11/09/2015   Elevated troponin 11/09/2015   Gastroenteritis and colitis, viral 08/10/2015   Chest pain 08/09/2015   Dehydration 08/09/2015   Acute kidney injury (HCC) 08/09/2015   Essential hypertension 08/09/2015    Orientation ACTIVITIES/SOCIAL BLADDER RESPIRATION    Self, Time, Situation, Place  Active Continent Other (Comment) (2L of O2)  BEHAVIORAL SYMPTOMS/MOOD NEUROLOGICAL BOWEL NUTRITION STATUS      Continent  (heart healthy)  PHYSICIAN VISITS COMMUNICATION OF NEEDS Height & Weight Skin    Verbally   107 lbs. Normal          AMBULATORY STATUS RESPIRATION    Assist extensive Other (Comment) (2L of O2)      Personal Care Assistance Level of Assistance  Bathing, Dressing Bathing Assistance: Limited assistance   Dressing Assistance: Limited assistance      Functional Limitations Info                SPECIAL  CARE FACTORS FREQUENCY                      Additional Factors Info  Code Status, Allergies Code Status Info: DNR Allergies Info: sulfa antibiotics, latex and penicillian           Current Medications (11/20/2015):  This is the current hospital active medication list Current Facility-Administered Medications  Medication Dose Route Frequency Provider Last Rate Last Dose   0.9 %  sodium chloride infusion   Intravenous Continuous Shanker Levora Dredge, MD       acetaminophen (TYLENOL) tablet 650 mg  650 mg Oral Q6H PRN Eduard Clos, MD       Or   acetaminophen (TYLENOL) suppository 650 mg  650 mg Rectal Q6H PRN Eduard Clos, MD       amLODipine (NORVASC) tablet 10 mg  10 mg Oral Daily Maretta Bees, MD   10 mg at 11/19/15 1041   aspirin tablet 325 mg  325 mg Oral Daily Eduard Clos, MD   325 mg at 11/20/15 1031   cholecalciferol (VITAMIN D) tablet 2,000 Units  2,000 Units Oral Daily Eduard Clos, MD   2,000 Units at 11/20/15 1030   lisinopril (PRINIVIL,ZESTRIL) tablet 2.5 mg  2.5 mg Oral Daily Maretta Bees, MD   2.5 mg at 11/19/15 1041   metroNIDAZOLE (FLAGYL) IVPB 500 mg  500 mg Intravenous Q8H Maretta Bees, MD   500 mg at 11/20/15  1045   ondansetron (ZOFRAN) tablet 4 mg  4 mg Oral Q6H PRN Eduard ClosArshad N Kakrakandy, MD       Or   ondansetron [facility](ZOFRAN) injection 4 mg  4 mg Intravenous Q6H PRN Eduard ClosArshad N Kakrakandy, MD       Rivaroxaban (XARELTO) tablet 15 mg  15 mg Oral BID Gerhard MunchKimberly B Hammons, RPH   15 mg at 11/20/15 1030   Followed by   Melene Muller[START ON 12/09/2015] rivaroxaban (XARELTO) tablet 20 mg  20 mg Oral Q supper Kimberly B Hammons, RPH       sodium chloride 0.9 % injection 3 mL  3 mL Intravenous Q12H Eduard ClosArshad N Kakrakandy, MD   3 mL at 11/19/15 2200   vancomycin (VANCOCIN) 50 mg/mL oral solution 250 mg  250 mg Oral QID Maretta BeesShanker M Ghimire, MD   250 mg at 11/20/15 1031     Discharge Medications: Please see discharge summary for a list of  discharge medications.  Relevant Imaging Results:  Relevant Lab Results:  Recent Labs    Additional Information C-Diff.  Isolation precautions. SSN: [id number]  Lebron QuamSimpson, Suzanne Amanda, LCSW

## 2015-11-21 ENCOUNTER — Inpatient Hospital Stay (HOSPITAL_COMMUNITY): Payer: Medicare Other

## 2015-11-21 DIAGNOSIS — I5041 Acute combined systolic (congestive) and diastolic (congestive) heart failure: Secondary | ICD-10-CM

## 2015-11-21 LAB — BASIC METABOLIC PANEL
ANION GAP: 8 (ref 5–15)
BUN: 41 mg/dL — AB (ref 6–20)
CALCIUM: 10.5 mg/dL — AB (ref 8.9–10.3)
CO2: 27 mmol/L (ref 22–32)
CREATININE: 1.25 mg/dL — AB (ref 0.44–1.00)
Chloride: 95 mmol/L — ABNORMAL LOW (ref 101–111)
GFR calc Af Amer: 44 mL/min — ABNORMAL LOW (ref >60)
GFR calc non Af Amer: 38 mL/min — ABNORMAL LOW (ref >60)
GLUCOSE: 94 mg/dL (ref 65–99)
Potassium: 4.7 mmol/L (ref 3.5–5.1)
Sodium: 130 mmol/L — ABNORMAL LOW (ref 135–145)

## 2015-11-21 LAB — CBC WITH DIFFERENTIAL/PLATELET
BASOS PCT: 0 %
Basophils Absolute: 0 10*3/uL (ref 0.0–0.1)
Eosinophils Absolute: 0 10*3/uL (ref 0.0–0.7)
Eosinophils Relative: 0 %
HEMATOCRIT: 37.8 % (ref 36.0–46.0)
Hemoglobin: 12.5 g/dL (ref 12.0–15.0)
Lymphocytes Relative: 3 %
Lymphs Abs: 0.8 10*3/uL (ref 0.7–4.0)
MCH: 25.6 pg — ABNORMAL LOW (ref 26.0–34.0)
MCHC: 33.1 g/dL (ref 30.0–36.0)
MCV: 77.3 fL — ABNORMAL LOW (ref 78.0–100.0)
MONO ABS: 1.1 10*3/uL — AB (ref 0.1–1.0)
MONOS PCT: 4 %
NEUTROS ABS: 28 10*3/uL — AB (ref 1.7–7.7)
Neutrophils Relative %: 94 %
Platelets: 322 10*3/uL (ref 150–400)
RBC: 4.89 MIL/uL (ref 3.87–5.11)
RDW: 14.3 % (ref 11.5–15.5)
WBC: 29.9 10*3/uL — ABNORMAL HIGH (ref 4.0–10.5)

## 2015-11-21 LAB — PROCALCITONIN: Procalcitonin: 1.91 ng/mL

## 2015-11-21 LAB — OCCULT BLOOD X 1 CARD TO LAB, STOOL: Fecal Occult Bld: POSITIVE — AB

## 2015-11-21 LAB — BRAIN NATRIURETIC PEPTIDE: B NATRIURETIC PEPTIDE 5: 206.6 pg/mL — AB (ref 0.0–100.0)

## 2015-11-21 MED ORDER — FUROSEMIDE 10 MG/ML IJ SOLN
40.0000 mg | Freq: Once | INTRAMUSCULAR | Status: AC
  Administered 2015-11-21: 40 mg via INTRAVENOUS
  Filled 2015-11-21: qty 4

## 2015-11-21 MED ORDER — PIPERACILLIN-TAZOBACTAM 3.375 G IVPB 30 MIN
3.3750 g | Freq: Once | INTRAVENOUS | Status: AC
  Administered 2015-11-21: 3.375 g via INTRAVENOUS
  Filled 2015-11-21: qty 50

## 2015-11-21 MED ORDER — PIPERACILLIN-TAZOBACTAM 3.375 G IVPB
3.3750 g | Freq: Three times a day (TID) | INTRAVENOUS | Status: DC
  Administered 2015-11-21 – 2015-11-22 (×2): 3.375 g via INTRAVENOUS
  Filled 2015-11-21 (×4): qty 50

## 2015-11-21 NOTE — Clinical Social Work Note (Signed)
Clinical Social Work Assessment  Patient Details  Name: Anna Gonzales MRN: [id number] Date of Birth: 16-Jun-1926  Date of referral:  11/21/15               Reason for consult:  Facility Placement, Discharge Planning                Permission sought to share information with:  Family Supports, Oceanographeracility Contact Representative Permission granted to share information::  Yes, Verbal Permission Granted  Name::     Pharmacist, hospitalAudrey  Agency::  SNFs  Relationship::     Contact Information:     Housing/Transportation Living arrangements for the past 2 months:  Single Family Home Source of Information:  Patient Patient Interpreter Needed:  None Criminal Activity/Legal Involvement Pertinent to Current Situation/Hospitalization:  No - Comment as needed Significant Relationships:  Adult Children Lives with:  Self Do you feel safe going back to the place where you live?  Yes Need for family participation in patient care:  Yes (Comment)  Care giving concerns:  Patient and niece agreeable to SNF placement at discharge when medically stable as they do not feel the patient can manage at home at this time.   Social Worker assessment / plan:  CSW attempted to complete assessment with patient at bedside. Patient requests that the niece [name] be contacted to complete assessment as she feels short of breath. [name] states that she would like the patient placed at [facility] as this would be most convenient for the family. CSW explained SNF search/placement process and answered Audrey's questions. [name] plans to have an attorney meet with the patient tomorrow morning to complete process of appointing [name] as her power of attorney. CSW will followup with bed offers once available.  Employment status:  Retired Health and safety inspectornsurance information:  Medicare PT Recommendations:  Skilled Nursing Facility Information / Referral to community resources:  Skilled Nursing Facility  Patient/Family's Response to care:   Patient and family appear to be happy with the care the patient is receiving.  Patient/Family's Understanding of and Emotional Response to Diagnosis, Current Treatment, and Prognosis:  Patient and patient's family appear to have a good understanding of the reason for the patient's admission and post DC needs.  Emotional Assessment Appearance:  Appears stated age Attitude/Demeanor/Rapport:  Lethargic Affect (typically observed):  Accepting, Calm Orientation:  Oriented to Place, Oriented to Self, Oriented to Situation, Oriented to  Time Alcohol / Substance use:  Never Used Psych involvement (Current and /or in the community):  No (Comment)  Discharge Needs  Concerns to be addressed:  Discharge Planning Concerns Readmission within the last 30 days:  Yes Current discharge risk:  Physical Impairment, Chronically ill Barriers to Discharge:  Continued Medical Work up   The KrogerBryant Dason Gonzales MSW, [place] Key[place]CSW, [place], [phone]

## 2015-11-21 NOTE — Evaluation (Signed)
Clinical/Bedside Swallow Evaluation Patient Details  Name: Anna Gonzales MRN: [id number] Date of Birth: [birth year]  Today's Date: 11/21/2015 Time: SLP Start Time (ACUTE ONLY): 1504 SLP Stop Time (ACUTE ONLY): 1520 SLP Time Calculation (min) (ACUTE ONLY): 16 min  Past Medical History:  Past Medical History  Diagnosis Date   Hypertension    Past Surgical History:  Past Surgical History  Procedure Laterality Date   Orif distal radius fracture     HPI:  Anna Gonzales is a 81 y.o. female who was recently admitted for pyelonephritis and CHF last week was brought to the ER after patient was found to have increasing shortness of breath and weakness. Patient states she became short of breath last evening while sitting. Denies any chest pain or productive cough. Since discharge patient states she has been having multiple episodes of diarrhea and had at least 5 episodes yesterday. Denies any nausea vomiting or abdominal pain. In the ER patient's labs show significant leukocytosis. Patient does not have any definite signs of CHF decompensation at this time and was given 1 L normal seen bolus in the ER. Blood cultures were obtained and patient has been admitted for further management of shortness of breath and diarrhea. On my exam patient is not in distress. Patient states her shortness of breath has improved after admission. Patient has not had any further episodes of diarrhea.    Assessment / Plan / Recommendation Clinical Impression  Clinical swallowing evaluation was completed.  Limited oral mechanism exam was completed due to issues following all commands, however oral motor skills appear functional for PO intake.  Clinically the patient presented with mild oral dysphagia characterized by delayed oral transit for solids.  Swallow trigger was timely and hyo-laryngeal excursion appeared to be adequate.  Overt s/s of aspiration were not observed.  Will downgrade diet to dysphagia 3 as the patient  more easily masticated soft solids.  The patient may have thin liquids.  ST to follow up for therapeutic diet tolerance and goals of care.  Palliative care note was reviewed and note that the patient is potentially for full comfort care.    Aspiration Risk    Mild   Diet Recommendation   Dysphagia 3 with thin liquids.    Medication Administration: Whole meds with liquid    Other  Recommendations Oral Care Recommendations: Oral care BID   Follow up Recommendations   (to be determined)    Frequency and Duration min 2x/week  2 weeks       Prognosis Prognosis for Safe Diet Advancement: Fair Barriers to Reach Goals: Severity of deficits      Swallow Study   General Date of Onset: 11/17/15 HPI: Anna Gonzales is a 81 y.o. female who was recently admitted for pyelonephritis and CHF last week was brought to the ER after patient was found to have increasing shortness of breath and weakness. Patient states she became short of breath last evening while sitting. Denies any chest pain or productive cough. Since discharge patient states she has been having multiple episodes of diarrhea and had at least 5 episodes yesterday. Denies any nausea vomiting or abdominal pain. In the ER patient's labs show significant leukocytosis. Patient does not have any definite signs of CHF decompensation at this time and was given 1 L normal seen bolus in the ER. Blood cultures were obtained and patient has been admitted for further management of shortness of breath and diarrhea. On my exam patient is not in distress. Patient states  her shortness of breath has improved after admission. Patient has not had any further episodes of diarrhea.  Type of Study: Bedside Swallow Evaluation Previous Swallow Assessment: None noted.   Diet Prior to this Study: Regular;Thin liquids Temperature Spikes Noted: No Respiratory Status: Room air History of Recent Intubation: No Behavior/Cognition: Cooperative;Pleasant mood;Requires  cueing Oral Cavity Assessment: Within Functional Limits Oral Care Completed by SLP: No Vision: Functional for self-feeding Self-Feeding Abilities: Needs assist Patient Positioning: Upright in bed Baseline Vocal Quality: Low vocal intensity Volitional Cough: Weak Volitional Swallow: Able to elicit    Oral/Motor/Sensory Function Overall Oral Motor/Sensory Function: Within functional limits (Limited exam due to some issues following all commands.  )   Ice Chips Ice chips: Not tested   Thin Liquid Thin Liquid: Within functional limits Presentation: Spoon;Straw    Nectar Thick Nectar Thick Liquid: Not tested   Honey Thick Honey Thick Liquid: Not tested   Puree Puree: Impaired Presentation: Spoon Oral Phase Impairments: Impaired mastication Oral Phase Functional Implications: Prolonged oral transit   Solid Solid: Impaired Presentation: Spoon Oral Phase Impairments: Impaired mastication Oral Phase Functional Implications: Prolonged oral transit       Dimas Aguas N 11/21/2015,3:33 PM

## 2015-11-21 NOTE — Progress Notes (Signed)
ANTICOAGULATION CONSULT NOTE  Pharmacy Consult for Xarelto Indication: pulmonary embolus  Allergies  Allergen Reactions   Sulfa Antibiotics Nausea And Vomiting   Latex Hives and Rash   Penicillins Itching and Rash    Has patient had a PCN reaction causing immediate rash, facial/tongue/throat swelling, SOB or lightheadedness with hypotension: {Yes Has patient had a PCN reaction causing severe rash involving mucus membranes or skin necrosis:NO Has patient had a PCN reaction that equired hospitalizationNO Has patient had a PCN reaction occurring within the last 10 years: NO If all of the above answers are "NO", then may proceed with Cephalosporin use.    Patient Measurements: Height: 5\' 1"  (154.9 cm) Weight: 109 lb 9.6 oz (49.714 kg) IBW/kg (Calculated) : 47.8  Vital Signs: Temp: 98.1 F (36.7 C) (12/05 0509) Temp Source: Axillary (12/05 0509) BP: 123/84 mmHg (12/05 0509) Pulse Rate: 91 (12/05 0509)  Labs:  Recent Labs  11/18/15 1630  11/19/15 0242 11/20/15 0615 11/21/15 0430  HGB  --   < > 11.8* 12.1 12.5  HCT  --   --  36.3 38.1 37.8  PLT  --   --  272 342 322  CREATININE  --   --  0.84 1.24* 1.25*  TROPONINI 0.16*  --   --   --   --   < > = values in this interval not displayed.  Estimated Creatinine Clearance: 23.9 mL/min (by C-G formula based on Cr of 1.25).   Medical History: Past Medical History  Diagnosis Date   Hypertension     Medications:  Prescriptions prior to admission  Medication Sig Dispense Refill Last Dose   amLODipine (NORVASC) 10 MG tablet Take 10 mg by mouth daily.   11/17/2015 at Unknown time   aspirin 325 MG tablet Take 325 mg by mouth daily.   11/17/2015 at Unknown time   CALCIUM PO Take 1 tablet by mouth daily.   11/17/2015 at Unknown time   cefUROXime (CEFTIN) 250 MG tablet Take 1 tablet (250 mg total) by mouth 2 (two) times daily with a meal. 6 tablet 0 11/17/2015 at Unknown time   cholecalciferol (VITAMIN D) 1000 UNITS tablet  Take 2,000 Units by mouth daily.   11/17/2015 at Unknown time   Cyanocobalamin (VITAMIN B 12 PO) Take 1 tablet by mouth daily.   11/17/2015 at Unknown time   furosemide (LASIX) 40 MG tablet Take 1 tablet (40 mg total) by mouth daily. 30 tablet 0 11/17/2015 at Unknown time   lisinopril (PRINIVIL,ZESTRIL) 2.5 MG tablet Take 1 tablet (2.5 mg total) by mouth daily. 30 tablet 0 11/17/2015 at Unknown time   Multiple Vitamins-Minerals (EYE VITAMINS) CAPS Take 1 capsule by mouth daily.   11/17/2015 at Unknown time   Polyethyl Glycol-Propyl Glycol (SYSTANE OP) Apply 1 drop to eye daily. For both eyes   11/17/2015 at Unknown time    Assessment: 81 yo F presented with sudden onset of SOB and weakness with reported diarrhea (up to 5x day).  CT confirmed PE and patient is to be started on Xarelto. SCr has trended upt, and CrCl calculated using TBW is now ~23 ml/min. Xarelto use not recommended with CrCl<30. GOC/palliative conference with family planned for today. Will f/u renal function and discuss therapy with MD if does not improved  Of note, pt also has c.diff colitis and was started on PO Vancomycin/IV Flagyl  Goal of Therapy:  Therapeutic Anticoagulation Monitor platelets by anticoagulation protocol: Yes   Plan:  Xarelto 15mg  PO bid x 21  days Then Xarelto  daily with dinner. Mon CBC, s/sx bleeding Monitor renal function trend - Xarelto not recommended with CrCl<30  Babs Bertin, PharmD, BCPS Clinical Pharmacist Pager [phone] 11/21/2015 11:41 AM

## 2015-11-21 NOTE — Clinical Social Work Placement (Signed)
° °  CLINICAL SOCIAL WORK PLACEMENT  NOTE  Date:  11/21/2015  Patient Details  Name: Anna Gonzales B Odom MRN: [id number] Date of Birth: [birth year]  Clinical Social Work is seeking post-discharge placement for this patient at the Skilled  Nursing Facility level of care (*CSW will initial, date and re-position this form in  chart as items are completed):  Yes   Patient/family provided with [facility] Clinical Social Work Departments list of facilities offering this level of care within the geographic area requested by the patient (or if unable, by the patients family).  Yes   Patient/family informed of their freedom to choose among providers that offer the needed level of care, that participate in Medicare, Medicaid or managed care program needed by the patient, have an available bed and are willing to accept the patient.  Yes   Patient/family informed of [facility] interest [facility] and [facility], as well as of the fact that they are under no obligation to receive care at these facilities.  PASRR submitted to EDS on 11/21/15     PASRR number received on 11/21/15     Existing PASRR number confirmed on       FL2 transmitted to all facilities in geographic area requested by pt/family on 11/21/15     FL2 transmitted to all facilities within larger geographic area on       Patient informed that his/her managed care company has contracts with or will negotiate with certain facilities, including the following:            Patient/family informed of bed offers received.  Patient chooses bed at       Physician recommends and patient chooses bed at      Patient to be transferred to   on  .  Patient to be transferred to facility by       Patient family notified on   of transfer.  Name of family member notified:        PHYSICIAN       Additional Comment:    _______________________________________________ Venita Lickampbell, Martina Brodbeck B, LCSW 11/21/2015, 10:42 AM

## 2015-11-21 NOTE — Consult Note (Signed)
° °  [facility]   11/21/2015  Lawson Radarnez B Ainsley Jan 21, 1926 [id number] Patient was screened for referral and [facility] needs.  Chart encounter reveals that the patient will likely discharge to a skilled facility and palliative care notes.  At this time, will continue to follow for disposition and care management needs.  Spoke with inpatient RNCM as well.  For questions, please contact: Charlesetta ShanksVictoria Esaias Cleavenger, RN BSN CCM [facility] Liaison  [phone] business mobile phone

## 2015-11-21 NOTE — Consult Note (Signed)
Consultation Note Date: 11/21/2015   Patient Name: Anna Gonzales  DOB: 10/03/1926  MRN: [id number]  Age / Sex: 81 y.o., female  PCP: Merlene LaughterHal Stoneking, MD Referring Physician: Maretta BeesShanker M Ghimire, MD  Reason for Consultation: Establishing goals of care and Non pain symptom management    Clinical Assessment/Narrative: 81 yo with advanced heart failure, acute renal failure, and NSTEMI, admitted with C. Diff colitis related diarrhea. Worsening throughout hospitalization. Palliative called for goals of care and management of dyspnea.  I spoke with her niece Anna Gonzales, goals are comfort as a priority, treat reasonable medically reversible disease, they understand she is critically ill and deteriorating rapidly. They are trying to settle certain aspects of her estate and an attorney is coming this afternoon or first thing in AM. Hoping to resolve some of these issues- family also careing for her husband who has severe Alzheimers disease which has been challenging for the family.  After estate issues are sorted out will likely shift towards full comfort care-continue aggressive diuresis for comfort. Concern for PNA on CXR.  Contacts/Participants in Discussion: Primary Decision Maker: Anna PaganiniAudrey Relationship to Patient Niece  HCPOA: yes    SUMMARY OF RECOMMENDATIONS  Code Status/Advance Care Planning: DNR    Code Status Orders        Start     Ordered   11/18/15 0409  Do not attempt resuscitation (DNR)   Continuous    Question Answer Comment  In the event of cardiac or respiratory ARREST Do not call a code blue   In the event of cardiac or respiratory ARREST Do not perform Intubation, CPR, defibrillation or ACLS   In the event of cardiac or respiratory ARREST Use medication by any route, position, wound care, and other measures to relive pain and suffering. May use oxygen, suction and manual treatment of airway  obstruction as needed for comfort.      11/18/15 0409    Advance Directive Documentation        Most Recent Value   Type of Advance Directive  Living will   Pre-existing out of facility DNR order (yellow form or pink MOST form)     "MOST" Form in Place?        Other Directives:Advanced Directive  Symptom Management:   Dyspnea: Lasix, monitor UOP, ok to give despite renal function, Low dose Roxanol- hold until after attorney arrives   Palliative Prophylaxis:   Aspiration, Bowel Regimen, Delirium Protocol, Frequent Pain Assessment and Oral Care  Additional Recommendations (Limitations, Scope, Preferences):  Avoid Hospitalization, Minimize Medications, No Lab Draws and No Surgical Procedures   Psycho-social/Spiritual:  Support System: Fair Desire for further Chaplaincy support:yes Additional Recommendations: Education on Hospice  Prognosis: < 2 weeks  Discharge Planning: Hospice facility- likely will qualify soon.Will discuss with her niece tomorrow.   Chief Complaint/ Primary Diagnoses: Present on Admission:   NSTEMI (non-ST elevated myocardial infarction) (HCC)  SOB (shortness of breath)  Diarrhea  Elevated troponin  Hyponatremia  I have reviewed the medical record, interviewed the patient and family, and examined the patient. The following aspects are pertinent.  Past Medical History  Diagnosis Date   Hypertension    Social History   Social History   Marital Status: Married    Spouse Name: N/A   Number of Children: N/A   Years of Education: N/A   Social History Main Topics   Smoking status: Never Smoker    Smokeless tobacco: None   Alcohol Use: No   Drug Use: No  Sexual Activity: Not Asked   Other Topics Concern   None   Social History Narrative   Family History  Problem Relation Age of Onset   Stroke Mother    Kidney disease Mother    Arrhythmia Mother    Asthma Father    Asthma Sister    Lung cancer Sister     Heart attack Brother    Scheduled Meds:  cholecalciferol  2,000 Units Oral Daily   metronidazole  500 mg Intravenous Q8H   rivaroxaban  15 mg Oral BID   Followed by   Melene Muller ON 12/09/2015] rivaroxaban  20 mg Oral Q supper   sodium chloride  3 mL Intravenous Q12H   vancomycin  250 mg Oral QID   Continuous Infusions:  sodium chloride 40 mL/hr at 11/21/15 1152   PRN Meds:.acetaminophen **OR** acetaminophen, ondansetron **OR** ondansetron (ZOFRAN) IV Medications Prior to Admission:  Prior to Admission medications   Medication Sig Start Date End Date Taking? Authorizing Provider  amLODipine (NORVASC) 10 MG tablet Take 10 mg by mouth daily.   Yes Historical Provider, MD  aspirin 325 MG tablet Take 325 mg by mouth daily.   Yes Historical Provider, MD  CALCIUM PO Take 1 tablet by mouth daily.   Yes Historical Provider, MD  cefUROXime (CEFTIN) 250 MG tablet Take 1 tablet (250 mg total) by mouth 2 (two) times daily with a meal. 11/11/15  Yes Rhetta Mura, MD  cholecalciferol (VITAMIN D) 1000 UNITS tablet Take 2,000 Units by mouth daily.   Yes Historical Provider, MD  Cyanocobalamin (VITAMIN B 12 PO) Take 1 tablet by mouth daily.   Yes Historical Provider, MD  furosemide (LASIX) 40 MG tablet Take 1 tablet (40 mg total) by mouth daily. 11/11/15  Yes Rhetta Mura, MD  lisinopril (PRINIVIL,ZESTRIL) 2.5 MG tablet Take 1 tablet (2.5 mg total) by mouth daily. 11/11/15  Yes Rhetta Mura, MD  Multiple Vitamins-Minerals (EYE VITAMINS) CAPS Take 1 capsule by mouth daily.   Yes Historical Provider, MD  Polyethyl Glycol-Propyl Glycol (SYSTANE OP) Apply 1 drop to eye daily. For both eyes   Yes Historical Provider, MD   Allergies  Allergen Reactions   Sulfa Antibiotics Nausea And Vomiting   Latex Hives and Rash   Penicillins Itching and Rash    Has patient had a PCN reaction causing immediate rash, facial/tongue/throat swelling, SOB or lightheadedness with hypotension:  {Yes Has patient had a PCN reaction causing severe rash involving mucus membranes or skin necrosis:NO Has patient had a PCN reaction that equired hospitalizationNO Has patient had a PCN reaction occurring within the last 10 years: NO If all of the above answers are "NO", then may proceed with Cephalosporin use.    Review of Systems  Physical Exam  Vital Signs: BP 123/84 mmHg   Pulse 91   Temp(Src) 98.1 F (36.7 C) (Axillary)   Resp 22   Ht  (1.549 m)   Wt 49.714 kg (109 lb 9.6 oz)   BMI 20.72 kg/m2   SpO2 98%  SpO2: SpO2: 98 % O2 Device:SpO2: 98 % O2 Flow Rate: .O2 Flow Rate (L/min): 2 L/min  IO: Intake/output summary:  Intake/Output Summary (Last 24 hours) at 11/21/15 1217 Last data filed at 11/21/15 1057  Gross per 24 hour  Intake 2486.25 ml  Output      1 ml  Net 2485.25 ml    LBM: Last BM Date: 11/19/15 Baseline Weight: Weight: 47.628 kg (105 lb) Most recent weight: Weight: 49.714 kg (109 lb 9.6  oz)      Palliative Assessment/Data:  Flowsheet Rows        Most Recent Value   Intake Tab    Referral Department  Hospitalist   Unit at Time of Referral  Cardiac/Telemetry Unit   Palliative Care Primary Diagnosis  Cardiac   Date Notified  11/18/15   Palliative Care Type  New Palliative care   Reason for referral  Clarify Goals of Care   Date of Admission  11/18/15   # of days IP prior to Palliative referral  0   Clinical Assessment    Psychosocial & Spiritual Assessment    Palliative Care Outcomes       Additional Data Reviewed:  CBC:    Component Value Date/Time   WBC 29.9* 11/21/2015 0430   HGB 12.5 11/21/2015 0430   HCT 37.8 11/21/2015 0430   PLT 322 11/21/2015 0430   MCV 77.3* 11/21/2015 0430   NEUTROABS 28.0* 11/21/2015 0430   LYMPHSABS 0.8 11/21/2015 0430   MONOABS 1.1* 11/21/2015 0430   EOSABS 0.0 11/21/2015 0430   BASOSABS 0.0 11/21/2015 0430   Comprehensive Metabolic Panel:    Component Value Date/Time   NA 130* 11/21/2015 0430   K 4.7  11/21/2015 0430   CL 95* 11/21/2015 0430   CO2 27 11/21/2015 0430   BUN 41* 11/21/2015 0430   CREATININE 1.25* 11/21/2015 0430   GLUCOSE 94 11/21/2015 0430   CALCIUM 10.5* 11/21/2015 0430   AST 20 11/18/2015 0537   ALT 9* 11/18/2015 0537   ALKPHOS 69 11/18/2015 0537   BILITOT 0.7 11/18/2015 0537   PROT 4.3* 11/18/2015 0537   ALBUMIN 1.8* 11/18/2015 0537     Time In: 11:45 Time Out: 12:25 Time Total: 40 min Greater than 50%  of this time was spent counseling and coordinating care related to the above assessment and plan.  Signed by: Hilbert Odor, DO  11/21/2015, 12:17 PM  Please contact Palliative Medicine Team phone at [phone] for questions and concerns.

## 2015-11-21 NOTE — Progress Notes (Addendum)
PATIENT DETAILS Name: Anna Gonzales Age: 81 y.o. Sex: female Date of Birth: Jul 14, 1926 Admit Date: 11/17/2015 Admitting Physician Eduard Clos, MD ZOX:[id number] Maisie Fus, MD  Subjective: Diarrhea continues-positive better. Looks very weak and deconditioned and uncomfortable today. Claims some shortness of breath.  Assessment/Plan: Principal Problem: Sepsis: Secondary to C. difficile colitis. Continue IV Flagyl, oral vancomycin. Unfortunately although leukocytosis has decreased, she appears very uncomfortable/short of breath this morning. Chest x-ray suggestive of either CHF or pneumonia. One dose of Lasix ordered, check BNP and pro calcitonin. Would like to avoid antibiotics in the setting of C. difficile.   Addendum 2:40 pm: Spoke with Patient/niece at bedside-more comfortable. Explained deterioration-and possible PNA in a setting of C Diff colitis. Both were aware of potential of worsening diarrhea in case of using Abx for PNA-but were wanting IV Abx and were accepting risks. Will start Zosyn-but family agreeable NOT to escalate care beyond what we are doing, and that if any further deterioration-they were willing to transition to comfort measures.   Active Problems: C. difficile colitis: See above  Acute hypoxic respiratory failure: Secondary to pulmonary embolism, on anticoagulation. Initially improved-but now with worsening hypoxia- secondary to CHF/pneumonia. Trial of Lasix 1, decrease IV fluids and follow clinical course. Check pro calcitonin/. BP.  ARF: Likely prerenal azotemia in the setting of sepsis/diarrhea-did also received contrast for CT chest 2 days back. Discontinue ACEI-follow.  Pulmonary embolism with left lower extremity DVT: CT angiogram chest done after patient complained of shortness of breathon admission, positive for small pulmonary embolism-started on anticoagulation with Xarelto. Lower extremity Doppler positive for left-sided DVT.    Mechanical fall: Occurred 12/3 AM-patient slid out of her bed and landed on her left lower extremity. No head trauma. No swelling of her left lower extremity, able to bear weight on both legs. Monitor and follow closely.  Elevated troponin: Mildly elevated troponin-trend is flat and not consistent with ACS-suspect demand ischemia from sepsis. Recent echocardiogram on 11/09/15 EF preserved without any wall motion abnormality, recent nuclear stress test on 11/25 negative for ischemia. EKG negative for acute abnormalities. Denies chest pain.  Hyponatremia: Has chronic hyponatremia baseline-suspect related to volume loss from diarrhea. Appears euvolemic, Improving-follow electrolytes.   Hypokalemia: Secondary to diarrhea, repleted, check periodically.  ?Acute on Chronic diastolic heart failure: More hypoxic today-one dose of Lasix, follow volume status.   Hypertension: Controlled-blood pressure soft-discontinue amlodipine, lisinopril discontinued on 12/4. c  Deconditioning/failure to thrive syndrome: Secondary to advanced age with numerous recent illnesses/hospitalization-PT/Nutrition eval-SNF on discharge  Palliative care/goals of care: DNR in place. Admitted with Severe sepsis from C. difficile colitis, PE/DVT hospital course complicated by development of hypoxia from either CHF/pneumonia. Given advanced age/frailty/recurrent admissions-severe deconditioning-poor overall prognosis. Palliative care planning on family conference today.  Disposition: Remain inpatient-SNF when medically ready  Antimicrobial agents  See below  Anti-infectives    Start     Dose/Rate Route Frequency Ordered Stop   11/20/15 1000  vancomycin (VANCOCIN) 50 mg/mL oral solution 250 mg     250 mg Oral 4 times daily 11/20/15 0953 12/02/15 0959   11/20/15 1000  metroNIDAZOLE (FLAGYL) IVPB 500 mg     500 mg 100 mL/hr over 60 Minutes Intravenous Every 8 hours 11/20/15 0953     11/18/15 1000  vancomycin (VANCOCIN) 50  mg/mL oral solution 125 mg  Status:  Discontinued     125 mg Oral 4 times daily 11/18/15 0830 11/20/15  1610   11/18/15 0415  metroNIDAZOLE (FLAGYL) IVPB 500 mg  Status:  Discontinued     500 mg 100 mL/hr over 60 Minutes Intravenous Every 8 hours 11/18/15 0409 11/18/15 0829      DVT Prophylaxis: Xarelto  Code Status: DNR  Family Communication Niece Ms Lalla Brothers at bedside  Procedures: None  CONSULTS:  None  Time spent 25 minutes-Greater than 50% of this time was spent in counseling, explanation of diagnosis, planning of further management, and coordination of care.  MEDICATIONS: Scheduled Meds:  amLODipine  10 mg Oral Daily   cholecalciferol  2,000 Units Oral Daily   metronidazole  500 mg Intravenous Q8H   rivaroxaban  15 mg Oral BID   Followed by   Melene Muller ON 12/09/2015] rivaroxaban  20 mg Oral Q supper   sodium chloride  3 mL Intravenous Q12H   vancomycin  250 mg Oral QID   Continuous Infusions:  sodium chloride Stopped (11/21/15 0801)   PRN Meds:.acetaminophen **OR** acetaminophen, ondansetron **OR** ondansetron (ZOFRAN) IV    PHYSICAL EXAM: Vital signs in last 24 hours: Filed Vitals:   11/20/15 1029 11/20/15 1330 11/20/15 1959 11/21/15 0509  BP: 108/46  106/84 123/84  Pulse:   93 91  Temp:  97.7 F (36.5 C) 98.3 F (36.8 C) 98.1 F (36.7 C)  TempSrc:  Oral Axillary Axillary  Resp:      Height:      Weight:    49.714 kg (109 lb 9.6 oz)  SpO2:   94% 98%    Weight change: 1.089 kg (2 lb 6.4 oz) Filed Weights   11/19/15 0439 11/20/15 0500 11/21/15 0509  Weight: 46.63 kg (102 lb 12.8 oz) 48.626 kg (107 lb 3.2 oz) 49.714 kg (109 lb 9.6 oz)   Body mass index is 20.72 kg/(m^2).   Gen Exam: Awake and alert with clear speech.  Neck: Supple, No JVD.   Chest: B/L Clear.   CVS: S1 S2 Regular, no murmurs.  Abdomen: soft, BS +, non tender, mildly distended.  Extremities: no edema, lower extremities warm to touch. Neurologic: Non Focal.   Skin: No  Rash.   Wounds: N/A.   Intake/Output from previous day:  Intake/Output Summary (Last 24 hours) at 11/21/15 1109 Last data filed at 11/21/15 0801  Gross per 24 hour  Intake 2406.25 ml  Output      1 ml  Net 2405.25 ml     LAB RESULTS: CBC  Recent Labs Lab 11/17/15 2112 11/18/15 0537 11/19/15 0242 11/20/15 0615 11/21/15 0430  WBC 24.9* 27.2* 31.5* 35.8* 29.9*  HGB 12.1 11.2* 11.8* 12.1 12.5  HCT 36.8 33.6* 36.3 38.1 37.8  PLT 263 259 272 342 322  MCV 76.0* 76.2* 77.4* 77.8* 77.3*  MCH 25.0* 25.4* 25.2* 24.7* 25.6*  MCHC 32.9 33.3 32.5 31.8 33.1  RDW 13.4 13.7 13.9 14.1 14.3  LYMPHSABS 1.1 0.8  --   --  0.8  MONOABS 1.1* 1.4*  --   --  1.1*  EOSABS 0.0 0.0  --   --  0.0  BASOSABS 0.0 0.0  --   --  0.0    Chemistries   Recent Labs Lab 11/17/15 2112 11/18/15 0537 11/19/15 0242 11/20/15 0615 11/21/15 0430  NA 126* 128* 128* 128* 130*  K 3.7 3.4* 4.3 4.5 4.7  CL 87* 90* 93* 94* 95*  CO2 GLUCOSE 110* 101* 91 132* 94  BUN 28* 24* 24* 38* 41*  CREATININE 0.92 0.74 0.84 1.24* 1.25*  CALCIUM 9.9 9.8 9.9 10.5* 10.5*    CBG: No results for input(s): GLUCAP in the last 168 hours.  GFR Estimated Creatinine Clearance: 23.9 mL/min (by C-G formula based on Cr of 1.25).  Coagulation profile No results for input(s): INR, PROTIME in the last 168 hours.  Cardiac Enzymes  Recent Labs Lab 11/18/15 0537 11/18/15 1104 11/18/15 1630  TROPONINI 0.15* 0.17* 0.16*    Invalid input(s): POCBNP No results for input(s): DDIMER in the last 72 hours. No results for input(s): HGBA1C in the last 72 hours. No results for input(s): CHOL, HDL, LDLCALC, TRIG, CHOLHDL, LDLDIRECT in the last 72 hours. No results for input(s): TSH, T4TOTAL, T3FREE, THYROIDAB in the last 72 hours.  Invalid input(s): FREET3 No results for input(s): VITAMINB12, FOLATE, FERRITIN, TIBC, IRON, RETICCTPCT in the last 72 hours. No results for input(s): LIPASE, AMYLASE in the last 72  hours.  Urine Studies No results for input(s): UHGB, CRYS in the last 72 hours.  Invalid input(s): UACOL, UAPR, USPG, UPH, UTP, UGL, UKET, UBIL, UNIT, UROB, ULEU, UEPI, UWBC, URBC, UBAC, CAST, UCOM, BILUA  MICROBIOLOGY: Recent Results (from the past 240 hour(s))  Blood culture (routine x 2)     Status: None (Preliminary result)   Collection Time: 11/17/15 11:49 PM  Result Value Ref Range Status   Specimen Description BLOOD RIGHT HAND  Final   Special Requests BOTTLES DRAWN AEROBIC AND ANAEROBIC 5CC  Final   Culture NO GROWTH 2 DAYS  Final   Report Status PENDING  Incomplete  Blood culture (routine x 2)     Status: None (Preliminary result)   Collection Time: 11/17/15 11:53 PM  Result Value Ref Range Status   Specimen Description BLOOD RIGHT WRIST  Final   Special Requests BOTTLES DRAWN AEROBIC AND ANAEROBIC 5CC  Final   Culture NO GROWTH 2 DAYS  Final   Report Status PENDING  Incomplete  C difficile quick scan w PCR reflex     Status: Abnormal   Collection Time: 11/18/15  6:36 AM  Result Value Ref Range Status   C Diff antigen POSITIVE (A) NEGATIVE Final   C Diff toxin POSITIVE (A) NEGATIVE Final   C Diff interpretation   Final    CRITICAL RESULT CALLED TO, READ BACK BY AND VERIFIED WITH:    Comment: SPOKE TO RN W. HIX AT 805 ON 11/17/2016 PER K.PEELE    RADIOLOGY STUDIES/RESULTS: Dg Chest 2 View  10/31/2015  CLINICAL DATA:  Shortness of breath and weakness, 4 days duration. EXAM: CHEST  2 VIEW COMPARISON:  08/09/2015 and previous FINDINGS: Heart size is normal. There is atherosclerosis of the aorta. There is chronic blunting of the posterior costophrenic angles. The lungs show slightly increased chronic interstitial markings but there is no evidence of focal active infiltrate, mass or collapse. There are old rib fractures on the left. No acute bone finding. IMPRESSION: Atherosclerosis of the aorta. Mild chronic interstitial lung markings. No focal or active process otherwise.  Chronic blunting of the posterior costophrenic angles presumed secondary to scarring. Electronically Signed   By: Paulina FusiMark  Shogry M.D.   On: 10/31/2015 19:36   Ct Angio Chest Pe W/cm &/or Wo Cm  11/18/2015  ADDENDUM REPORT: 11/18/2015 13:22 ADDENDUM: Critical Value/emergent results were called by telephone at the time of interpretation on 11/18/2015 at 1:20 pm to Dr. Jerral RalphGhimire, who verbally acknowledged these results. Electronically Signed   By: Bary RichardStan  Maynard M.D.   On: 11/18/2015 13:22  11/18/2015  CLINICAL DATA:  Increased shortness of breath  EXAM: CT ANGIOGRAPHY CHEST WITH CONTRAST TECHNIQUE: Multidetector CT imaging of the chest was performed using the standard protocol during bolus administration of intravenous contrast. Multiplanar CT image reconstructions and MIPs were obtained to evaluate the vascular anatomy. CONTRAST:  80mL OMNIPAQUE IOHEXOL 350 MG/ML SOLN COMPARISON:  Chest x-ray dated 11/17/2015. FINDINGS: Focal nonocclusive pulmonary emboli are present within 2 segmental pulmonary artery branches to the lateral aspects of the right middle lobe, best seen on images 135 through 143 of series 7. No other convincing pulmonary emboli identified within the main, lobar, or central segmental pulmonary arteries bilaterally. Some of the more peripheral segmental and subsegmental pulmonary arteries are difficult to definitively characterize due to inadequate contrast opacification, but I suspect additional small focal pulmonary embolus within a peripheral subsegmental pulmonary artery branch to the posterior-medial right lower lobe. Heart size is upper normal. No pericardial effusion. No evidence of right heart strain. Scattered atherosclerotic changes are seen along the walls of the normal-caliber thoracic aorta. There are small bilateral pleural effusions with adjacent bibasilar atelectasis. There is also mild biapical scarring/fibrosis. Remainder of the lungs are clear. Trachea and central bronchi are  unremarkable. There is upper abdominal ascites, seen on limited images of the upper abdomen. Degenerative changes are seen throughout the thoracic spine but no acute osseous abnormality. Old healed rib fractures noted on the left. Review of the MIP images confirms the above findings. IMPRESSION: 1. Focal acute-appearing nonocclusive pulmonary emboli within 2 segmental pulmonary artery branches to the lateral aspects of the right middle lobe. 2. Suspect additional small pulmonary embolus within a peripheral subsegmental pulmonary artery branch to the posterior-medial right lower lobe, but not convincing. 3. Small bilateral pleural effusions with adjacent bibasilar atelectasis. 4. Upper abdominal ascites, incompletely imaged. 5. Thoracic aorta atherosclerosis.  No thoracic aortic aneurysm. Electronically Signed: By: Bary Richard M.D. On: 11/18/2015 13:14   Nm Myocar Multi W/spect W/wall Motion / Ef  11/11/2015  CLINICAL DATA:  81 year old female with wide-complex tachycardia. EXAM: MYOCARDIAL IMAGING WITH SPECT (REST AND PHARMACOLOGIC-STRESS) GATED LEFT VENTRICULAR WALL MOTION STUDY LEFT VENTRICULAR EJECTION FRACTION TECHNIQUE: Standard myocardial SPECT imaging was performed after resting intravenous injection of 10 mCi Tc-11m sestamibi. Subsequently, intravenous infusion of Lexiscan was performed under the supervision of the Cardiology staff. At peak effect of the drug, 30 mCi Tc-72m sestamibi was injected intravenously and standard myocardial SPECT imaging was performed. Quantitative gated imaging was also performed to evaluate left ventricular wall motion, and estimate left ventricular ejection fraction. COMPARISON:  Chest radiograph 11/09/2015 FINDINGS: Perfusion: There is no evidence to suggest reversible ischemia. However, there are decreased counts along the inferior wall on both the rest and stress images and this is probably related to increased uptake in the upper abdomen. As a result, limited  evaluation of the inferior wall. Wall Motion: Normal left ventricular wall motion. No left ventricular dilation. Left Ventricular Ejection Fraction: 70 % End diastolic volume 51 ml End systolic volume 15 ml IMPRESSION: 1. No evidence for reversible ischemia but limited evaluation of the inferior wall as described. 2. Normal left ventricular wall motion. 3. Left ventricular ejection fraction is 70%. 4. Low-risk stress test findings*. *2012 Appropriate Use Criteria for Coronary Revascularization Focused Update: J Am Coll Cardiol. 2012;59(9):857-881. http://content.dementiazones.com.aspx?articleid=[email or web address] Electronically Signed   By: Richarda Overlie M.D.   On: 11/11/2015 13:50   Dg Chest Port 1 View  11/21/2015  CLINICAL DATA:  Shortness of Breath EXAM: PORTABLE CHEST 1 VIEW COMPARISON:  Chest CT November 18, 2015 and chest radiograph November 17, 2015 FINDINGS: There is airspace consolidation in the left mid lower lung zones. There is patchy airspace consolidation in the right base. There is a small left effusion. There is cardiomegaly mild pulmonary venous hypertension. There is atherosclerotic calcification aorta. No bone lesions. There is thoracolumbar levoscoliosis. IMPRESSION: Airspace consolidation bilaterally. Question progression of congestive heart failure versus superimposed pneumonia. Both entities may exist concurrently. Electronically Signed   By: Bretta Bang III M.D.   On: 11/21/2015 08:39   Dg Chest Port 1 View  11/17/2015  CLINICAL DATA:  81 year old female with shortness of breath EXAM: PORTABLE CHEST 1 VIEW COMPARISON:  Chest radiograph dated 11/09/2015 FINDINGS: Single-view of the chest demonstrate emphysematous changes of the lungs. There is an area of increased density at the left mid and lower lung field which appears slightly improved compared to the prior study. Small left pleural effusion may be present. The cardiac silhouette is within normal limits. The osseous structures are  grossly unremarkable. IMPRESSION: Emphysema with partial interval improvement of the previously seen left mid and lower lung airspace density. Electronically Signed   By: Elgie Collard M.D.   On: 11/17/2015 22:54   Dg Chest Port 1 View  11/09/2015  CLINICAL DATA:  Shortness of breath and nausea tonight. EXAM: PORTABLE CHEST 1 VIEW COMPARISON:  10/31/2015 FINDINGS: Mild cardiac enlargement without significant vascular congestion. Emphysematous changes with chronic interstitial fibrosis in the lungs. Increased density in the left lung base may be due to shallow inspiration but infiltration or atelectasis in the left base is not excluded. No pneumothorax. Calcified and tortuous aorta. IMPRESSION: Cardiac enlargement. Emphysematous changes with interstitial fibrosis in the lungs. Possible superimposed infiltration or atelectasis in the left base. Electronically Signed   By: Burman Nieves M.D.   On: 11/09/2015 00:29   Dg Abd 2 Views  11/20/2015  CLINICAL DATA:  Abdominal distension EXAM: ABDOMEN - 2 VIEW COMPARISON:  None. FINDINGS: No disproportionately dilated small bowel loops or appreciable air-fluid levels. Minimal colonic stool volume. No evidence of pneumatosis or pneumoperitoneum. Pleural effusions are seen in both lung bases. Atherosclerotic abdominal aorta. Tiny round calcification overlies the right bladder, nonspecific, likely a calcified venous phlebolith, cannot exclude a distal right ureteral stone. IMPRESSION: 1. Nonobstructive bowel gas pattern. 2. Small bilateral pleural effusions. Electronically Signed   By: Delbert Phenix M.D.   On: 11/20/2015 13:29    Jeoffrey Massed, MD  [facility] Pager:[phone]  If 7PM-7AM, please contact night-coverage [email or web address] Password [email or web address] 11/21/2015, 11:09 AM   LOS: 4 days

## 2015-11-21 NOTE — Progress Notes (Signed)
ANTIBIOTIC CONSULT NOTE - INITIAL  Pharmacy Consult for zosyn Indication: aspiration PNA  Allergies  Allergen Reactions   Sulfa Antibiotics Nausea And Vomiting   Latex Hives and Rash   Penicillins Itching and Rash    Has patient had a PCN reaction causing immediate rash, facial/tongue/throat swelling, SOB or lightheadedness with hypotension: {Yes Has patient had a PCN reaction causing severe rash involving mucus membranes or skin necrosis:NO Has patient had a PCN reaction that equired hospitalizationNO Has patient had a PCN reaction occurring within the last 10 years: NO If all of the above answers are "NO", then may proceed with Cephalosporin use.    Patient Measurements: Height:  (154.9 cm) Weight: 109 lb 9.6 oz (49.714 kg) IBW/kg (Calculated) : 47.8   Vital Signs: Temp: 98.6 F (37 C) (12/05 1400) Temp Source: Axillary (12/05 1400) BP: 105/40 mmHg (12/05 1400) Pulse Rate: 94 (12/05 1400) Intake/Output from previous day: 12/04 0701 - 12/05 0700 In: 1531.3 [P.O.:600; I.V.:731.3; IV Piggyback:200] Out: 1 [Stool:1] Intake/Output from this shift: Total I/O In: 1435 [P.O.:240; I.V.:995; IV Piggyback:200] Out: -   Labs:  Recent Labs  11/19/15 0242 11/20/15 0615 11/21/15 0430  WBC 31.5* 35.8* 29.9*  HGB 11.8* 12.1 12.5  PLT 272 342 322  CREATININE 0.84 1.24* 1.25*   Estimated Creatinine Clearance: 23.9 mL/min (by C-G formula based on Cr of 1.25). No results for input(s): VANCOTROUGH, VANCOPEAK, VANCORANDOM, GENTTROUGH, GENTPEAK, GENTRANDOM, TOBRATROUGH, TOBRAPEAK, TOBRARND, AMIKACINPEAK, AMIKACINTROU, AMIKACIN in the last 72 hours.   Microbiology: Recent Results (from the past 720 hour(s))  Culture, Urine     Status: None   Collection Time: 11/09/15  1:42 AM  Result Value Ref Range Status   Specimen Description URINE, RANDOM  Final   Special Requests CX ADDED AT 0315 ON [id number]  Final   Culture >=100,000 COLONIES/mL ESCHERICHIA COLI  Final   Report  Status 11/11/2015 FINAL  Final   Organism ID, Bacteria ESCHERICHIA COLI  Final      Susceptibility   Escherichia coli - MIC*    AMPICILLIN <=2 SENSITIVE Sensitive     CEFAZOLIN <=4 SENSITIVE Sensitive     CEFTRIAXONE <=1 SENSITIVE Sensitive     CIPROFLOXACIN <=0.25 SENSITIVE Sensitive     GENTAMICIN <=1 SENSITIVE Sensitive     IMIPENEM <=0.25 SENSITIVE Sensitive     NITROFURANTOIN <=16 SENSITIVE Sensitive     TRIMETH/SULFA <=20 SENSITIVE Sensitive     AMPICILLIN/SULBACTAM <=2 SENSITIVE Sensitive     PIP/TAZO <=4 SENSITIVE Sensitive     * >=100,000 COLONIES/mL ESCHERICHIA COLI  Blood culture (routine x 2)     Status: None (Preliminary result)   Collection Time: 11/17/15 11:49 PM  Result Value Ref Range Status   Specimen Description BLOOD RIGHT HAND  Final   Special Requests BOTTLES DRAWN AEROBIC AND ANAEROBIC 5CC  Final   Culture NO GROWTH 3 DAYS  Final   Report Status PENDING  Incomplete  Blood culture (routine x 2)     Status: None (Preliminary result)   Collection Time: 11/17/15 11:53 PM  Result Value Ref Range Status   Specimen Description BLOOD RIGHT WRIST  Final   Special Requests BOTTLES DRAWN AEROBIC AND ANAEROBIC 5CC  Final   Culture NO GROWTH 3 DAYS  Final   Report Status PENDING  Incomplete  C difficile quick scan w PCR reflex     Status: Abnormal   Collection Time: 11/18/15  6:36 AM  Result Value Ref Range Status   C Diff antigen POSITIVE (A) NEGATIVE  Final   C Diff toxin POSITIVE (A) NEGATIVE Final   C Diff interpretation   Final    CRITICAL RESULT CALLED TO, READ BACK BY AND VERIFIED WITH:    Comment: SPOKE TO RN W. HIX AT 805 ON 11/17/2016 PER K.PEELE    Medical History: Past Medical History  Diagnosis Date   Hypertension     Assessment: [age] yof being treated for positive cdiff with vanc PO/Flagyl IV, now starting on Zosyn for aspiration PNA, D#1. CXR shows questionable CHF vs. Superimposed PNA from 12/5. Afeb, wbc now beginning to trend down to 29.9. SCr  trending up, CrCl~23. Itching/rash allergy reported with penicillins, but patient has tolerated cephalosporins here in the past.  Vanc PO 12/2 >> Flagyl IV 12/4 >> Zosyn 12/5 >>  12/1 BCx2>>ngtd 12/2 cdiff + (antigen/toxin)   Goal of Therapy:  Eradication of infxn  Plan:  Zosyn 3.375g IV (30min inf); then Zosyn 3.375g IV q8h Monitor clinical progress, c/s, renal function, abx plan/LOT  Babs BertinHaley Shiv Shuey, PharmD, [facility] LLCBCPS Clinical Pharmacist Pager [phone] 11/21/2015 2:49 PM

## 2015-11-22 ENCOUNTER — Encounter: Payer: Medicare Other | Admitting: Cardiovascular Disease

## 2015-11-22 DIAGNOSIS — J189 Pneumonia, unspecified organism: Secondary | ICD-10-CM

## 2015-11-22 DIAGNOSIS — I214 Non-ST elevation (NSTEMI) myocardial infarction: Secondary | ICD-10-CM

## 2015-11-22 LAB — CBC WITH DIFFERENTIAL/PLATELET
BASOS PCT: 0 %
Basophils Absolute: 0 10*3/uL (ref 0.0–0.1)
Eosinophils Absolute: 0 10*3/uL (ref 0.0–0.7)
Eosinophils Relative: 0 %
HCT: 39.1 % (ref 36.0–46.0)
Hemoglobin: 12.4 g/dL (ref 12.0–15.0)
LYMPHS ABS: 1.1 10*3/uL (ref 0.7–4.0)
LYMPHS PCT: 4 %
MCH: 24.6 pg — AB (ref 26.0–34.0)
MCHC: 31.7 g/dL (ref 30.0–36.0)
MCV: 77.6 fL — AB (ref 78.0–100.0)
MONO ABS: 1.1 10*3/uL — AB (ref 0.1–1.0)
Monocytes Relative: 4 %
NEUTROS ABS: 26.4 10*3/uL — AB (ref 1.7–7.7)
NEUTROS PCT: 92 %
PLATELETS: 306 10*3/uL (ref 150–400)
RBC: 5.04 MIL/uL (ref 3.87–5.11)
RDW: 14.4 % (ref 11.5–15.5)
WBC: 28.6 10*3/uL — AB (ref 4.0–10.5)

## 2015-11-22 LAB — BASIC METABOLIC PANEL
Anion gap: 7 (ref 5–15)
BUN: 55 mg/dL — AB (ref 6–20)
CALCIUM: 10.4 mg/dL — AB (ref 8.9–10.3)
CO2: 26 mmol/L (ref 22–32)
Chloride: 97 mmol/L — ABNORMAL LOW (ref 101–111)
Creatinine, Ser: 1.73 mg/dL — ABNORMAL HIGH (ref 0.44–1.00)
GFR calc Af Amer: 29 mL/min — ABNORMAL LOW (ref >60)
GFR, EST NON AFRICAN AMERICAN: 25 mL/min — AB (ref >60)
GLUCOSE: 138 mg/dL — AB (ref 65–99)
POTASSIUM: 4.4 mmol/L (ref 3.5–5.1)
Sodium: 130 mmol/L — ABNORMAL LOW (ref 135–145)

## 2015-11-22 LAB — OCCULT BLOOD X 1 CARD TO LAB, STOOL: FECAL OCCULT BLD: POSITIVE — AB

## 2015-11-22 MED ORDER — CETYLPYRIDINIUM CHLORIDE 0.05 % MT LIQD
7.0000 mL | Freq: Two times a day (BID) | OROMUCOSAL | Status: DC
  Administered 2015-11-22 – 2015-11-23 (×3): 7 mL via OROMUCOSAL

## 2015-11-22 MED ORDER — ENSURE ENLIVE PO LIQD
237.0000 mL | Freq: Two times a day (BID) | ORAL | Status: DC
  Administered 2015-11-22: 237 mL via ORAL

## 2015-11-22 MED ORDER — PIPERACILLIN-TAZOBACTAM IN DEX 2-0.25 GM/50ML IV SOLN
2.2500 g | Freq: Three times a day (TID) | INTRAVENOUS | Status: DC
  Administered 2015-11-22 – 2015-11-23 (×3): 2.25 g via INTRAVENOUS
  Filled 2015-11-22 (×5): qty 50

## 2015-11-22 MED ORDER — MORPHINE SULFATE (PF) 2 MG/ML IV SOLN
1.0000 mg | INTRAVENOUS | Status: DC | PRN
  Administered 2015-11-22: 1 mg via INTRAVENOUS
  Filled 2015-11-22: qty 1

## 2015-11-22 NOTE — Progress Notes (Signed)
Initial Nutrition Assessment  DOCUMENTATION CODES:   Severe malnutrition in context of chronic illness  INTERVENTION:    Ensure Enlive PO BID, each supplement provides 350 kcal and 20 grams of protein  NUTRITION DIAGNOSIS:   Malnutrition related to chronic illness as evidenced by severe depletion of body fat, severe depletion of muscle mass.  GOAL:   Patient will meet greater than or equal to 90% of their needs  MONITOR:   PO intake, Supplement acceptance, Weight trends, Labs, I & O's  REASON FOR ASSESSMENT:   Consult Poor PO  ASSESSMENT:   81 yo female with numerous recent admission, failure to thrive synd, chronic diastolic heart failure admitted with sepsis from C Diff Colitis. Developed acute resp failure on admission, CT Angio chest showed small Pul Embolism. Hospital course complicated by renal failure and worsening resp failulre from either CHF or Asp PNA/HCAP. Seen by palliative care-plans are to continue with current medical treatment-DNR in place. Family aware that if no improvement or any further deterioration-may need to initiate comfort measures.   Patient sleeping in bed during RD visit. Niece at bedside who reports patient's mobility and health has been going downhill for the past few months and she has declined greatly over the past 2 weeks. She has lost weight, but that is not reflected by her current weight. Nutrition-Focused physical exam completed. Findings are severe fat depletion, severe muscle depletion, and mild edema. Patient with severe PCM. She has been eating poorly since admission; consumed half of an Ensure Enlive and some water and juice today, but not much else per her niece. She has trouble tolerating milk products (milk and ice cream) and niece thinks she may be lactose intolerant. She is okay with Ensure supplements (since they are lactose free) and yogurt. She started feeling sick after eating some of the Magic Cup today. RN has been giving Ensure  supplements with medications.  Diet Order:  Diet regular Room service appropriate?: Yes; Fluid consistency:: Thin  Skin:  Reviewed, no issues  Last BM:  12/5  Height:   Ht Readings from Last 1 Encounters:  11/18/15 5\' 1"  (1.549 m)    Weight:   Wt Readings from Last 1 Encounters:  11/22/15 111 lb 9.6 oz (50.621 kg)    Ideal Body Weight:  47.7 kg  BMI:  Body mass index is 21.1 kg/(m^2).  Estimated Nutritional Needs:   Kcal:  1250-1450  Protein:  70-80 gm  Fluid:  1.4 L  EDUCATION NEEDS:   No education needs identified at this time  Joaquin CourtsKimberly Sebastian Lurz, RD, LDN, CNSC Pager [phone] After Hours Pager [phone]

## 2015-11-22 NOTE — Progress Notes (Signed)
PT Cancellation Note  Patient Details Name: Anna Gonzales MRN: [id number] DOB: May 26, 1926   Cancelled Treatment:    Reason Eval/Treat Not Completed: Pain limiting ability to participate.  Upon PT arrival pt is in bed moaning due to abdominal pain.  Notified RN.    Michail JewelsAshley Parr PT, DPT [phone] Pager: [phone] 11/22/2015, 1:40 PM

## 2015-11-22 NOTE — Progress Notes (Signed)
Speech Language Pathology Treatment: Dysphagia  Patient Details Name: Anna Gonzales MRN: [id number] DOB: September 13, 1926 Today's Date: 11/22/2015 Time: 8599-2341 SLP Time Calculation (min) (ACUTE ONLY): 19 min  Assessment / Plan / Recommendation Clinical Impression  Pt seen with minimal sips of juice this am, again no signs of aspiration observed. Pts family at bedside does report a history of esophageal dysphagia and early satiety. SLP offered recommendations regarding upright posture in bed, keeping pt upright to promote esophageal transit and providing regular oral care, all to reduce risk of aspiration. Pt to continue dys 3 diet and thin liquids. All education complete, no further intervention required.    HPI HPI: Anna Gonzales is a 81 y.o. female who was recently admitted for pyelonephritis and CHF last week was brought to the ER after patient was found to have increasing shortness of breath and weakness. Patient states she became short of breath last evening while sitting. Denies any chest pain or productive cough. Since discharge patient states she has been having multiple episodes of diarrhea and had at least 5 episodes yesterday. Denies any nausea vomiting or abdominal pain. In the ER patient's labs show significant leukocytosis. Patient does not have any definite signs of CHF decompensation at this time and was given 1 L normal seen bolus in the ER. Blood cultures were obtained and patient has been admitted for further management of shortness of breath and diarrhea. On my exam patient is not in distress. Patient states her shortness of breath has improved after admission. Patient has not had any further episodes of diarrhea.       SLP Plan  All goals met     Recommendations  Diet recommendations: Dysphagia 3 (mechanical soft);Thin liquid Liquids provided via: Cup;Straw Medication Administration: Whole meds with liquid Supervision: Full supervision/cueing for compensatory  strategies;Trained caregiver to feed patient Postural Changes and/or Swallow Maneuvers: Seated upright 90 degrees;Upright 30-60 min after meal              Plan: All goals met  Herbie Baltimore, MA CCC-SLP [phone]  Lynann Beaver 11/22/2015, 8:29 AM

## 2015-11-22 NOTE — Care Management (Signed)
[facility] are providing RN PT OT services. I will follow for discharge needs.  Thank you. Ayesha RumpfMary Yonjof RN BSN [facility] [phone]

## 2015-11-22 NOTE — Progress Notes (Signed)
PATIENT DETAILS Name: Anna Gonzales Age: 81 y.o. Sex: female Date of Birth: [birth year] Admit Date: 11/17/2015 Admitting Physician Eduard ClosArshad N Kakrakandy, MD WUJ:[id number],NFAPCP:[email or web address],HAL Maisie FusHOMAS, MD  Brief Narrative: 81 yo female with numerous recent admission, failure to thrive synd, chronic diastolic heart failure admitted with sepsis from C Diff Colitis. Developed acute resp failure on admission, CT Angio chest showed small Pul Embolism. Hospital course complicated by renal failure and worsening resp failulre from either CHF or Asp PNA/HCAP. Seen by palliative care-plans are to continue with current medical treatment-DNR in place. Family aware that if no improvement or any further deterioration-may need to initiate comfort measures.   Subjective: Diarrhea continues-some mild improvement. SOB better. More comfortable today  Assessment/Plan: Principal Problem: Sepsis: Secondary to C. difficile colitis. Continue IV Flagyl, oral vancomycin. Unfortunately seems to have developed HCAP/Aspiration PNA. After discussion with patient/family-Zosyn added 12/5. Will plan on 5 days of treatment.  Active Problems: C. difficile colitis: Diarrhea slowly improving-still with persistent leukocytosis-although slowly downtrending. Continue IV Flagyl/Oral Vanco.   Acute hypoxic respiratory failure: Secondary to pulmonary embolism/HCAP. Continue Xarelto. Follow.Taper off O2-family/patient aware of poor overall prognoses-and if no improvement/or deterioration-need to initiate comfort measures   ARF: Likely prerenal azotemia in the setting of sepsis/diarrhea-did also received contrast for CT chest 2 days back. Discontinue ACEI-follow.Continue gentle hydration.  Pulmonary embolism with left lower extremity DVT: CT angiogram chest done after patient complained of shortness of breathon admission, positive for small pulmonary embolism-started on anticoagulation with Xarelto. Lower extremity Doppler positive for  left-sided DVT.   Mechanical fall: Occurred 12/3 AM-patient slid out of her bed and landed on her left lower extremity. No head trauma. No swelling of her left lower extremity, able to bear weight on both legs. Monitor and follow closely.  Elevated troponin: Mildly elevated troponin-trend is flat and not consistent with ACS-suspect demand ischemia from sepsis. Recent echocardiogram on 11/09/15 EF preserved without any wall motion abnormality, recent nuclear stress test on 11/25 negative for ischemia. EKG negative for acute abnormalities. Denies chest pain.  Hyponatremia: Has chronic hyponatremia baseline-suspect related to volume loss from diarrhea. Appears euvolemic, Improving-follow electrolytes.   Hypokalemia: Secondary to diarrhea, repleted, check periodically.  Chronic diastolic heart failure: Suspect SOB was more for HCAP-follow volume status closely while on IVF.   Hypertension: Controlled-blood pressure soft-discontinued amlodipine/ lisinopril-monitor off all anti-hypertensive's  Deconditioning/failure to thrive syndrome: Secondary to advanced age with numerous recent illnesses/hospitalization-PT/Nutrition eval-SNF on discharge  Palliative care/goals of care: DNR in place. Admitted with Severe sepsis from C. difficile colitis, PE/DVT hospital course complicated by development of hypoxia from either CHF/pneumonia. Given advanced age/frailty/recurrent admissions-severe deconditioning-poor overall prognosis-both patient and family aware.   Disposition: Remain inpatient-SNF when medically ready  Antimicrobial agents  See below  Anti-infectives    Start     Dose/Rate Route Frequency Ordered Stop   11/21/15 2330  piperacillin-tazobactam (ZOSYN) IVPB 3.375 g     3.375 g 12.5 mL/hr over 240 Minutes Intravenous 3 times per day 11/21/15 1452     11/21/15 1500  piperacillin-tazobactam (ZOSYN) IVPB 3.375 g     3.375 g 100 mL/hr over 30 Minutes Intravenous  Once 11/21/15 1452 11/21/15 1558    11/20/15 1000  vancomycin (VANCOCIN) 50 mg/mL oral solution 250 mg     250 mg Oral 4 times daily 11/20/15 0953 12/02/15 0959   11/20/15 1000  metroNIDAZOLE (FLAGYL) IVPB 500 mg     500 mg  100 mL/hr over 60 Minutes Intravenous Every 8 hours 11/20/15 0953     11/18/15 1000  vancomycin (VANCOCIN) 50 mg/mL oral solution 125 mg  Status:  Discontinued     125 mg Oral 4 times daily 11/18/15 0830 11/20/15 0953   11/18/15 0415  metroNIDAZOLE (FLAGYL) IVPB 500 mg  Status:  Discontinued     500 mg 100 mL/hr over 60 Minutes Intravenous Every 8 hours 11/18/15 0409 11/18/15 0829      DVT Prophylaxis: Xarelto  Code Status: DNR  Family Communication Niece Ms Lalla Brothers at bedside  Procedures: None  CONSULTS:  None  Time spent 25 minutes-Greater than 50% of this time was spent in counseling, explanation of diagnosis, planning of further management, and coordination of care.  MEDICATIONS: Scheduled Meds:  antiseptic oral rinse  7 mL Mouth Rinse BID   cholecalciferol  2,000 Units Oral Daily   metronidazole  500 mg Intravenous Q8H   piperacillin-tazobactam (ZOSYN)  IV  3.375 g Intravenous 3 times per day   rivaroxaban  15 mg Oral BID   Followed by   Melene Muller ON 12/09/2015] rivaroxaban  20 mg Oral Q supper   sodium chloride  3 mL Intravenous Q12H   vancomycin  250 mg Oral QID   Continuous Infusions:  sodium chloride 75 mL/hr at 11/22/15 0753   PRN Meds:.acetaminophen **OR** acetaminophen, ondansetron **OR** ondansetron (ZOFRAN) IV    PHYSICAL EXAM: Vital signs in last 24 hours: Filed Vitals:   11/21/15 1105 11/21/15 1400 11/21/15 2112 11/22/15 0511  BP: 112/61 105/40 100/50 105/47  Pulse:  94 97 88  Temp:  98.6 F (37 C) 98.2 F (36.8 C) 97.5 F (36.4 C)  TempSrc:  Axillary Axillary Oral  Resp:  Height:      Weight:    50.621 kg (111 lb 9.6 oz)  SpO2:  94% 94% 93%    Weight change: 0.907 kg (2 lb) Filed Weights   11/20/15 0500 11/21/15 0509 11/22/15  0511  Weight: 48.626 kg (107 lb 3.2 oz) 49.714 kg (109 lb 9.6 oz) 50.621 kg (111 lb 9.6 oz)   Body mass index is 21.1 kg/(m^2).   Gen Exam: Awake and alert with clear speech. Looks frail and weak. Neck: Supple, No JVD.   Chest:Few bibasilar rales. CVS: S1 S2 Regular, no murmurs.  Abdomen: soft, BS +, non tender, mildly distended.  Extremities: no edema, lower extremities warm to touch. Neurologic: Non Focal.   Skin: No Rash.   Wounds: N/A.   Intake/Output from previous day:  Intake/Output Summary (Last 24 hours) at 11/22/15 0854 Last data filed at 11/21/15 1823  Gross per 24 hour  Intake 905.33 ml  Output      0 ml  Net 905.33 ml     LAB RESULTS: CBC  Recent Labs Lab 11/17/15 2112 11/18/15 0537 11/19/15 0242 11/20/15 0615 11/21/15 0430 11/22/15 0413  WBC 24.9* 27.2* 31.5* 35.8* 29.9* 28.6*  HGB 12.1 11.2* 11.8* 12.1 12.5 12.4  HCT 36.8 33.6* 36.3 38.1 37.8 39.1  PLT 263 259 272 342 322 306  MCV 76.0* 76.2* 77.4* 77.8* 77.3* 77.6*  MCH 25.0* 25.4* 25.2* 24.7* 25.6* 24.6*  MCHC 32.9 33.3 32.5 31.8 33.1 31.7  RDW 13.4 13.7 13.9 14.1 14.3 14.4  LYMPHSABS 1.1 0.8  --   --  0.8 1.1  MONOABS 1.1* 1.4*  --   --  1.1* 1.1*  EOSABS 0.0 0.0  --   --  0.0 0.0  BASOSABS 0.0 0.0  --   --  0.0 0.0    Chemistries   Recent Labs Lab 11/18/15 0537 11/19/15 0242 11/20/15 0615 11/21/15 0430 11/22/15 0413  NA 128* 128* 128* 130* 130*  K 3.4* 4.3 4.5 4.7 4.4  CL 90* 93* 94* 95* 97*  CO2 GLUCOSE 101* 91 132* 94 138*  BUN 24* 24* 38* 41* 55*  CREATININE 0.74 0.84 1.24* 1.25* 1.73*  CALCIUM 9.8 9.9 10.5* 10.5* 10.4*    CBG: No results for input(s): GLUCAP in the last 168 hours.  GFR Estimated Creatinine Clearance: 17.3 mL/min (by C-G formula based on Cr of 1.73).  Coagulation profile No results for input(s): INR, PROTIME in the last 168 hours.  Cardiac Enzymes  Recent Labs Lab 11/18/15 0537 11/18/15 1104 11/18/15 1630  TROPONINI 0.15* 0.17*  0.16*    Invalid input(s): POCBNP No results for input(s): DDIMER in the last 72 hours. No results for input(s): HGBA1C in the last 72 hours. No results for input(s): CHOL, HDL, LDLCALC, TRIG, CHOLHDL, LDLDIRECT in the last 72 hours. No results for input(s): TSH, T4TOTAL, T3FREE, THYROIDAB in the last 72 hours.  Invalid input(s): FREET3 No results for input(s): VITAMINB12, FOLATE, FERRITIN, TIBC, IRON, RETICCTPCT in the last 72 hours. No results for input(s): LIPASE, AMYLASE in the last 72 hours.  Urine Studies No results for input(s): UHGB, CRYS in the last 72 hours.  Invalid input(s): UACOL, UAPR, USPG, UPH, UTP, UGL, UKET, UBIL, UNIT, UROB, ULEU, UEPI, UWBC, URBC, UBAC, CAST, UCOM, BILUA  MICROBIOLOGY: Recent Results (from the past 240 hour(s))  Blood culture (routine x 2)     Status: None (Preliminary result)   Collection Time: 11/17/15 11:49 PM  Result Value Ref Range Status   Specimen Description BLOOD RIGHT HAND  Final   Special Requests BOTTLES DRAWN AEROBIC AND ANAEROBIC 5CC  Final   Culture NO GROWTH 3 DAYS  Final   Report Status PENDING  Incomplete  Blood culture (routine x 2)     Status: None (Preliminary result)   Collection Time: 11/17/15 11:53 PM  Result Value Ref Range Status   Specimen Description BLOOD RIGHT WRIST  Final   Special Requests BOTTLES DRAWN AEROBIC AND ANAEROBIC 5CC  Final   Culture NO GROWTH 3 DAYS  Final   Report Status PENDING  Incomplete  C difficile quick scan w PCR reflex     Status: Abnormal   Collection Time: 11/18/15  6:36 AM  Result Value Ref Range Status   C Diff antigen POSITIVE (A) NEGATIVE Final   C Diff toxin POSITIVE (A) NEGATIVE Final   C Diff interpretation   Final    CRITICAL RESULT CALLED TO, READ BACK BY AND VERIFIED WITH:    Comment: SPOKE TO RN W. HIX AT 805 ON 11/17/2016 PER K.PEELE    RADIOLOGY STUDIES/RESULTS: Dg Chest 2 View  10/31/2015  CLINICAL DATA:  Shortness of breath and weakness, 4 days duration. EXAM:  CHEST  2 VIEW COMPARISON:  08/09/2015 and previous FINDINGS: Heart size is normal. There is atherosclerosis of the aorta. There is chronic blunting of the posterior costophrenic angles. The lungs show slightly increased chronic interstitial markings but there is no evidence of focal active infiltrate, mass or collapse. There are old rib fractures on the left. No acute bone finding. IMPRESSION: Atherosclerosis of the aorta. Mild chronic interstitial lung markings. No focal or active process otherwise. Chronic blunting of the posterior costophrenic angles presumed secondary to scarring. Electronically Signed   By: Scherrie Bateman.D.  On: 10/31/2015 19:36   Ct Angio Chest Pe W/cm &/or Wo Cm  11/18/2015  ADDENDUM REPORT: 11/18/2015 13:22 ADDENDUM: Critical Value/emergent results were called by telephone at the time of interpretation on 11/18/2015 at 1:20 pm to Dr. Jerral Ralph, who verbally acknowledged these results. Electronically Signed   By: Bary Richard M.D.   On: 11/18/2015 13:22  11/18/2015  CLINICAL DATA:  Increased shortness of breath EXAM: CT ANGIOGRAPHY CHEST WITH CONTRAST TECHNIQUE: Multidetector CT imaging of the chest was performed using the standard protocol during bolus administration of intravenous contrast. Multiplanar CT image reconstructions and MIPs were obtained to evaluate the vascular anatomy. CONTRAST:  80mL OMNIPAQUE IOHEXOL 350 MG/ML SOLN COMPARISON:  Chest x-ray dated 11/17/2015. FINDINGS: Focal nonocclusive pulmonary emboli are present within 2 segmental pulmonary artery branches to the lateral aspects of the right middle lobe, best seen on images 135 through 143 of series 7. No other convincing pulmonary emboli identified within the main, lobar, or central segmental pulmonary arteries bilaterally. Some of the more peripheral segmental and subsegmental pulmonary arteries are difficult to definitively characterize due to inadequate contrast opacification, but I suspect additional small focal  pulmonary embolus within a peripheral subsegmental pulmonary artery branch to the posterior-medial right lower lobe. Heart size is upper normal. No pericardial effusion. No evidence of right heart strain. Scattered atherosclerotic changes are seen along the walls of the normal-caliber thoracic aorta. There are small bilateral pleural effusions with adjacent bibasilar atelectasis. There is also mild biapical scarring/fibrosis. Remainder of the lungs are clear. Trachea and central bronchi are unremarkable. There is upper abdominal ascites, seen on limited images of the upper abdomen. Degenerative changes are seen throughout the thoracic spine but no acute osseous abnormality. Old healed rib fractures noted on the left. Review of the MIP images confirms the above findings. IMPRESSION: 1. Focal acute-appearing nonocclusive pulmonary emboli within 2 segmental pulmonary artery branches to the lateral aspects of the right middle lobe. 2. Suspect additional small pulmonary embolus within a peripheral subsegmental pulmonary artery branch to the posterior-medial right lower lobe, but not convincing. 3. Small bilateral pleural effusions with adjacent bibasilar atelectasis. 4. Upper abdominal ascites, incompletely imaged. 5. Thoracic aorta atherosclerosis.  No thoracic aortic aneurysm. Electronically Signed: By: Bary Richard M.D. On: 11/18/2015 13:14   Nm Myocar Multi W/spect W/wall Motion / Ef  11/11/2015  CLINICAL DATA:  81 year old female with wide-complex tachycardia. EXAM: MYOCARDIAL IMAGING WITH SPECT (REST AND PHARMACOLOGIC-STRESS) GATED LEFT VENTRICULAR WALL MOTION STUDY LEFT VENTRICULAR EJECTION FRACTION TECHNIQUE: Standard myocardial SPECT imaging was performed after resting intravenous injection of 10 mCi Tc-71m sestamibi. Subsequently, intravenous infusion of Lexiscan was performed under the supervision of the Cardiology staff. At peak effect of the drug, 30 mCi Tc-36m sestamibi was injected intravenously and  standard myocardial SPECT imaging was performed. Quantitative gated imaging was also performed to evaluate left ventricular wall motion, and estimate left ventricular ejection fraction. COMPARISON:  Chest radiograph 11/09/2015 FINDINGS: Perfusion: There is no evidence to suggest reversible ischemia. However, there are decreased counts along the inferior wall on both the rest and stress images and this is probably related to increased uptake in the upper abdomen. As a result, limited evaluation of the inferior wall. Wall Motion: Normal left ventricular wall motion. No left ventricular dilation. Left Ventricular Ejection Fraction: 70 % End diastolic volume 51 ml End systolic volume 15 ml IMPRESSION: 1. No evidence for reversible ischemia but limited evaluation of the inferior wall as described. 2. Normal left ventricular wall motion. 3. Left ventricular  ejection fraction is 70%. 4. Low-risk stress test findings*. *2012 Appropriate Use Criteria for Coronary Revascularization Focused Update: J Am Coll Cardiol. 2012;59(9):857-881. http://content.dementiazones.com.aspx?articleid=[email or web address] Electronically Signed   By: Richarda Overlie M.D.   On: 11/11/2015 13:50   Dg Chest Port 1 View  11/21/2015  CLINICAL DATA:  Shortness of Breath EXAM: PORTABLE CHEST 1 VIEW COMPARISON:  Chest CT November 18, 2015 and chest radiograph November 17, 2015 FINDINGS: There is airspace consolidation in the left mid lower lung zones. There is patchy airspace consolidation in the right base. There is a small left effusion. There is cardiomegaly mild pulmonary venous hypertension. There is atherosclerotic calcification aorta. No bone lesions. There is thoracolumbar levoscoliosis. IMPRESSION: Airspace consolidation bilaterally. Question progression of congestive heart failure versus superimposed pneumonia. Both entities may exist concurrently. Electronically Signed   By: Bretta Bang III M.D.   On: 11/21/2015 08:39   Dg Chest Port 1  View  11/17/2015  CLINICAL DATA:  81 year old female with shortness of breath EXAM: PORTABLE CHEST 1 VIEW COMPARISON:  Chest radiograph dated 11/09/2015 FINDINGS: Single-view of the chest demonstrate emphysematous changes of the lungs. There is an area of increased density at the left mid and lower lung field which appears slightly improved compared to the prior study. Small left pleural effusion may be present. The cardiac silhouette is within normal limits. The osseous structures are grossly unremarkable. IMPRESSION: Emphysema with partial interval improvement of the previously seen left mid and lower lung airspace density. Electronically Signed   By: Elgie Collard M.D.   On: 11/17/2015 22:54   Dg Chest Port 1 View  11/09/2015  CLINICAL DATA:  Shortness of breath and nausea tonight. EXAM: PORTABLE CHEST 1 VIEW COMPARISON:  10/31/2015 FINDINGS: Mild cardiac enlargement without significant vascular congestion. Emphysematous changes with chronic interstitial fibrosis in the lungs. Increased density in the left lung base may be due to shallow inspiration but infiltration or atelectasis in the left base is not excluded. No pneumothorax. Calcified and tortuous aorta. IMPRESSION: Cardiac enlargement. Emphysematous changes with interstitial fibrosis in the lungs. Possible superimposed infiltration or atelectasis in the left base. Electronically Signed   By: Burman Nieves M.D.   On: 11/09/2015 00:29   Dg Abd 2 Views  11/20/2015  CLINICAL DATA:  Abdominal distension EXAM: ABDOMEN - 2 VIEW COMPARISON:  None. FINDINGS: No disproportionately dilated small bowel loops or appreciable air-fluid levels. Minimal colonic stool volume. No evidence of pneumatosis or pneumoperitoneum. Pleural effusions are seen in both lung bases. Atherosclerotic abdominal aorta. Tiny round calcification overlies the right bladder, nonspecific, likely a calcified venous phlebolith, cannot exclude a distal right ureteral stone. IMPRESSION:  1. Nonobstructive bowel gas pattern. 2. Small bilateral pleural effusions. Electronically Signed   By: Delbert Phenix M.D.   On: 11/20/2015 13:29    Jeoffrey Massed, MD  [facility] Pager:[phone]  If 7PM-7AM, please contact night-coverage [email or web address] Password [email or web address] 11/22/2015, 8:54 AM   LOS: 5 days

## 2015-11-22 NOTE — Care Management Important Message (Signed)
Important Message  Patient Details  Name: Anna Gonzales MRN: [id number] Date of Birth: 1926-07-04   Medicare Important Message Given:  Yes    Gala LewandowskyGraves-Bigelow, Delma Drone Kaye, RN 11/22/2015, 11:49 AM

## 2015-11-22 NOTE — Progress Notes (Signed)
ANTIBIOTIC CONSULT NOTE  Pharmacy Consult for zosyn Indication: aspiration PNA  Allergies  Allergen Reactions   Sulfa Antibiotics Nausea And Vomiting   Latex Hives and Rash   Penicillins Itching and Rash    Has patient had a PCN reaction causing immediate rash, facial/tongue/throat swelling, SOB or lightheadedness with hypotension: {Yes Has patient had a PCN reaction causing severe rash involving mucus membranes or skin necrosis:NO Has patient had a PCN reaction that equired hospitalizationNO Has patient had a PCN reaction occurring within the last 10 years: NO If all of the above answers are "NO", then may proceed with Cephalosporin use.    Patient Measurements: Height: 5\' 1"  (154.9 cm) Weight: 111 lb 9.6 oz (50.621 kg) IBW/kg (Calculated) : 47.8   Vital Signs: Temp: 97.5 F (36.4 C) (12/06 0511) Temp Source: Oral (12/06 0511) BP: 105/47 mmHg (12/06 0511) Pulse Rate: 88 (12/06 0511) Intake/Output from previous day: 12/05 0701 - 12/06 0700 In: 1900.3 [P.O.:360; I.V.:1240.3; IV Piggyback:300] Out: -  Intake/Output from this shift:    Labs:  Recent Labs  11/20/15 0615 11/21/15 0430 11/22/15 0413  WBC 35.8* 29.9* 28.6*  HGB 12.1 12.5 12.4  PLT 342 322 306  CREATININE 1.24* 1.25* 1.73*   Estimated Creatinine Clearance: 17.3 mL/min (by C-G formula based on Cr of 1.73). No results for input(s): VANCOTROUGH, VANCOPEAK, VANCORANDOM, GENTTROUGH, GENTPEAK, GENTRANDOM, TOBRATROUGH, TOBRAPEAK, TOBRARND, AMIKACINPEAK, AMIKACINTROU, AMIKACIN in the last 72 hours.   Microbiology: Recent Results (from the past 720 hour(s))  Culture, Urine     Status: None   Collection Time: 11/09/15  1:42 AM  Result Value Ref Range Status   Specimen Description URINE, RANDOM  Final   Special Requests CX ADDED AT 0315 ON [id number]  Final   Culture >=100,000 COLONIES/mL ESCHERICHIA COLI  Final   Report Status 11/11/2015 FINAL  Final   Organism ID, Bacteria ESCHERICHIA COLI  Final       Susceptibility   Escherichia coli - MIC*    AMPICILLIN <=2 SENSITIVE Sensitive     CEFAZOLIN <=4 SENSITIVE Sensitive     CEFTRIAXONE <=1 SENSITIVE Sensitive     CIPROFLOXACIN <=0.25 SENSITIVE Sensitive     GENTAMICIN <=1 SENSITIVE Sensitive     IMIPENEM <=0.25 SENSITIVE Sensitive     NITROFURANTOIN <=16 SENSITIVE Sensitive     TRIMETH/SULFA <=20 SENSITIVE Sensitive     AMPICILLIN/SULBACTAM <=2 SENSITIVE Sensitive     PIP/TAZO <=4 SENSITIVE Sensitive     * >=100,000 COLONIES/mL ESCHERICHIA COLI  Blood culture (routine x 2)     Status: None (Preliminary result)   Collection Time: 11/17/15 11:49 PM  Result Value Ref Range Status   Specimen Description BLOOD RIGHT HAND  Final   Special Requests BOTTLES DRAWN AEROBIC AND ANAEROBIC 5CC  Final   Culture NO GROWTH 3 DAYS  Final   Report Status PENDING  Incomplete  Blood culture (routine x 2)     Status: None (Preliminary result)   Collection Time: 11/17/15 11:53 PM  Result Value Ref Range Status   Specimen Description BLOOD RIGHT WRIST  Final   Special Requests BOTTLES DRAWN AEROBIC AND ANAEROBIC 5CC  Final   Culture NO GROWTH 3 DAYS  Final   Report Status PENDING  Incomplete  C difficile quick scan w PCR reflex     Status: Abnormal   Collection Time: 11/18/15  6:36 AM  Result Value Ref Range Status   C Diff antigen POSITIVE (A) NEGATIVE Final   C Diff toxin POSITIVE (A) NEGATIVE Final  C Diff interpretation   Final    CRITICAL RESULT CALLED TO, READ BACK BY AND VERIFIED WITH:    Comment: SPOKE TO RN W. HIX AT 805 ON 11/17/2016 PER K.PEELE    Medical History: Past Medical History  Diagnosis Date   Hypertension     Assessment: 56 yof being treated for positive cdiff with vanc PO/Flagyl IV, now starting on Zosyn for aspiration PNA, D#1. CXR shows questionable CHF vs. Superimposed PNA from 12/5. Afeb, wbc now beginning to trend down to 29.9. SCr trending up, CrCl~17. Itching/rash allergy reported with penicillins, but patient has  tolerated cephalosporins here in the past. Plan 5 days of therapy per Dr. Jerral Ralph.  Vanc PO 12/2 >> Flagyl IV 12/4 >> Zosyn 12/5 >>  12/1 BCx2>>ngtd 12/2 cdiff + (antigen/toxin)   Goal of Therapy:  Eradication of infxn  Plan:  Zosyn to 2.25g IV q8h for renal function - D#2/5 Monitor clinical progress, c/s, renal function  Babs Bertin, PharmD, [facility] Clinical Pharmacist Pager [phone] 11/22/2015 11:04 AM

## 2015-11-22 NOTE — Consult Note (Signed)
Bedside visit today.  Patient resting with eyes closed, she has had a little improvement in her number of diarrhea stools today, appetite is still very poor.  Few sips of nutrition shake taken.  Niece at bedside, reports that Mrs. Chase PicketLineberry did have several visitors today including the paster of her church and that she was very clear in her speech and orientation to person and place.  Goal is still for SNF when ready.  Family is having a difficult time with Mrs. Linberry's husband who is at their home with dementia, requiring supervision by family  Members 24hrs daily.  Not sure if he comprehends what is taking place here at the hospital with his wife.  Family continues to work on [employer] and estate paperwork. Not willing to discontinue full medical scope of treatment at this time.    Patient has been complaining of stomach pain today, not nausea but epigastric pain.  Difficult to distinguish if it is from the colon inflammation or poor PO intake.  Not cramping or associated with diarrhea episode.  IV MS has been ordered by attending q 4hr.  Will monitor to evaluate for effectiveness or need to titrate dose or schedule.    Cindra PresumeSue Ellen Kalep Full, RN-BC, MSN, [facility]

## 2015-11-22 NOTE — Plan of Care (Signed)
Problem: Skin Integrity: Goal: Risk for impaired skin integrity will decrease Outcome: Progressing Using proper skin care products such as incontinence cleanser and barrier cream to help with skin integrity.  Q 2 hour turning.

## 2015-11-23 DIAGNOSIS — N179 Acute kidney failure, unspecified: Secondary | ICD-10-CM

## 2015-11-23 DIAGNOSIS — A0472 Enterocolitis due to Clostridium difficile, not specified as recurrent: Secondary | ICD-10-CM | POA: Diagnosis present

## 2015-11-23 DIAGNOSIS — J189 Pneumonia, unspecified organism: Secondary | ICD-10-CM | POA: Clinically undetermined

## 2015-11-23 DIAGNOSIS — A419 Sepsis, unspecified organism: Secondary | ICD-10-CM | POA: Diagnosis present

## 2015-11-23 DIAGNOSIS — R6520 Severe sepsis without septic shock: Secondary | ICD-10-CM

## 2015-11-23 LAB — CBC
HCT: 38.5 % (ref 36.0–46.0)
HEMOGLOBIN: 12.8 g/dL (ref 12.0–15.0)
MCH: 25.4 pg — AB (ref 26.0–34.0)
MCHC: 33.2 g/dL (ref 30.0–36.0)
MCV: 76.4 fL — ABNORMAL LOW (ref 78.0–100.0)
Platelets: 281 10*3/uL (ref 150–400)
RBC: 5.04 MIL/uL (ref 3.87–5.11)
RDW: 14.6 % (ref 11.5–15.5)
WBC: 30.1 10*3/uL — ABNORMAL HIGH (ref 4.0–10.5)

## 2015-11-23 LAB — BASIC METABOLIC PANEL
ANION GAP: 10 (ref 5–15)
BUN: 67 mg/dL — ABNORMAL HIGH (ref 6–20)
CHLORIDE: 101 mmol/L (ref 101–111)
CO2: 20 mmol/L — ABNORMAL LOW (ref 22–32)
Calcium: 9.8 mg/dL (ref 8.9–10.3)
Creatinine, Ser: 2.02 mg/dL — ABNORMAL HIGH (ref 0.44–1.00)
GFR calc non Af Amer: 21 mL/min — ABNORMAL LOW (ref >60)
GFR, EST AFRICAN AMERICAN: 24 mL/min — AB (ref >60)
Glucose, Bld: 189 mg/dL — ABNORMAL HIGH (ref 65–99)
POTASSIUM: 4.5 mmol/L (ref 3.5–5.1)
SODIUM: 131 mmol/L — AB (ref 135–145)

## 2015-11-23 LAB — PROCALCITONIN: Procalcitonin: 7.82 ng/mL

## 2015-11-23 LAB — CULTURE, BLOOD (ROUTINE X 2)
CULTURE: NO GROWTH
Culture: NO GROWTH

## 2015-11-23 MED ORDER — ACETAMINOPHEN 325 MG PO TABS: 650.0000 mg | ORAL_TABLET | Freq: Four times a day (QID) | ORAL | Status: AC | PRN

## 2015-11-23 MED ORDER — MORPHINE SULFATE (PF) 2 MG/ML IV SOLN
1.0000 mg | INTRAVENOUS | Status: DC | PRN
  Administered 2015-11-23: 1 mg via INTRAVENOUS
  Filled 2015-11-23: qty 1

## 2015-11-23 MED ORDER — LORAZEPAM 2 MG/ML PO CONC: 1.0000 mg | ORAL | Status: AC | PRN

## 2015-11-23 MED ORDER — ONDANSETRON HCL 4 MG PO TABS: 4.0000 mg | ORAL_TABLET | Freq: Four times a day (QID) | ORAL | Status: AC | PRN

## 2015-11-23 MED ORDER — MORPHINE SULFATE (PF) 2 MG/ML IV SOLN: 1.0000 mg | INTRAVENOUS | Status: AC | PRN

## 2015-11-23 MED ORDER — LORAZEPAM 2 MG/ML PO CONC
1.0000 mg | ORAL | Status: DC | PRN
  Administered 2015-11-23: 1 mg via ORAL
  Filled 2015-11-23: qty 0.5
  Filled 2015-11-23: qty 1

## 2015-11-23 MED ORDER — MORPHINE SULFATE (CONCENTRATE) 10 MG/0.5ML PO SOLN
5.0000 mg | Freq: Four times a day (QID) | ORAL | Status: DC
  Administered 2015-11-23: 5 mg via ORAL
  Filled 2015-11-23 (×2): qty 0.5

## 2015-11-23 MED ORDER — MORPHINE SULFATE (CONCENTRATE) 10 MG/0.5ML PO SOLN: 5.0000 mg | Freq: Four times a day (QID) | ORAL | Status: AC

## 2015-11-23 NOTE — Progress Notes (Signed)
Continues to deteriorate-family has been in discussion over the past few days whether to transition to comfort measures. Creatinine and leukocytosis remain persistently elevated/worsened. After discussion with niece at bedside-patient and family 1 to transition to full comfort measures. They are agreeable in stopping all antibiotics, IV fluids, anticoagulation-and agreeable to being discharged to residential hospice with full comfort measures. Have consulted social work for residential hospice placement.

## 2015-11-23 NOTE — Discharge Summary (Signed)
PATIENT DETAILS Name: Anna Gonzales Age: 81 y.o. Sex: female Date of Birth: [birth year] MRN: [id number]. Admitting Physician: Eduard Clos, MD JXB:JYNWGNFAO,ZHY Maisie Fus, MD  Admit Date: 11/17/2015 Discharge date: 11/23/2015  Recommendations for Outpatient Follow-up:  1. Optimize comfort measures   PRIMARY DISCHARGE DIAGNOSIS:  Principal Problem:   SOB (shortness of breath) Active Problems:   Elevated troponin   NSTEMI (non-ST elevated myocardial infarction) (HCC)   Diarrhea   Hyponatremia      PAST MEDICAL HISTORY: Past Medical History  Diagnosis Date   Hypertension     DISCHARGE MEDICATIONS: Current Discharge Medication List    START taking these medications   Details  acetaminophen (TYLENOL) 325 MG tablet Take 2 tablets (650 mg total) by mouth every 6 (six) hours as needed for mild pain (or Fever >/= 101).    LORazepam (ATIVAN) 2 MG/ML concentrated solution Take 0.5 mLs (1 mg total) by mouth every 4 (four) hours as needed for anxiety, sedation or sleep.    morphine 2 MG/ML injection Inject 0.5 mLs (1 mg total) into the vein every 2 (two) hours as needed (sedation,comfort).    Morphine Sulfate (MORPHINE CONCENTRATE) 10 MG/0.5ML SOLN concentrated solution Take 0.25 mLs (5 mg total) by mouth every 6 (six) hours.    ondansetron (ZOFRAN) 4 MG tablet Take 1 tablet (4 mg total) by mouth every 6 (six) hours as needed for nausea.      STOP taking these medications     amLODipine (NORVASC) 10 MG tablet      aspirin 325 MG tablet      CALCIUM PO      cefUROXime (CEFTIN) 250 MG tablet      cholecalciferol (VITAMIN D) 1000 UNITS tablet      Cyanocobalamin (VITAMIN B 12 PO)      furosemide (LASIX) 40 MG tablet      lisinopril (PRINIVIL,ZESTRIL) 2.5 MG tablet      Multiple Vitamins-Minerals (EYE VITAMINS) CAPS      Polyethyl Glycol-Propyl Glycol (SYSTANE OP)         ALLERGIES:   Allergies  Allergen Reactions   Sulfa Antibiotics Nausea And  Vomiting   Latex Hives and Rash   Penicillins Itching and Rash    Has patient had a PCN reaction causing immediate rash, facial/tongue/throat swelling, SOB or lightheadedness with hypotension: {Yes Has patient had a PCN reaction causing severe rash involving mucus membranes or skin necrosis:NO Has patient had a PCN reaction that equired hospitalizationNO Has patient had a PCN reaction occurring within the last 10 years: NO If all of the above answers are "NO", then may proceed with Cephalosporin use.    BRIEF HPI:  See H&P, Labs, Consult and Test reports for all details in brief, patient is a very frail 81 year old female with numerous recurrent admissions was brought to the hospital for weakness and diarrhea.  CONSULTATIONS:   Palliative care  PERTINENT RADIOLOGIC STUDIES: Dg Chest 2 View  10/31/2015  CLINICAL DATA:  Shortness of breath and weakness, 4 days duration. EXAM: CHEST  2 VIEW COMPARISON:  08/09/2015 and previous FINDINGS: Heart size is normal. There is atherosclerosis of the aorta. There is chronic blunting of the posterior costophrenic angles. The lungs show slightly increased chronic interstitial markings but there is no evidence of focal active infiltrate, mass or collapse. There are old rib fractures on the left. No acute bone finding. IMPRESSION: Atherosclerosis of the aorta. Mild chronic interstitial lung markings. No focal or active process otherwise. Chronic blunting of the  posterior costophrenic angles presumed secondary to scarring. Electronically Signed   By: Paulina FusiMark  Shogry M.D.   On: 10/31/2015 19:36   Ct Angio Chest Pe W/cm &/or Wo Cm  11/18/2015  ADDENDUM REPORT: 11/18/2015 13:22 ADDENDUM: Critical Value/emergent results were called by telephone at the time of interpretation on 11/18/2015 at 1:20 pm to Dr. Jerral RalphGhimire, who verbally acknowledged these results. Electronically Signed   By: Bary RichardStan  Maynard M.D.   On: 11/18/2015 13:22  11/18/2015  CLINICAL DATA:  Increased  shortness of breath EXAM: CT ANGIOGRAPHY CHEST WITH CONTRAST TECHNIQUE: Multidetector CT imaging of the chest was performed using the standard protocol during bolus administration of intravenous contrast. Multiplanar CT image reconstructions and MIPs were obtained to evaluate the vascular anatomy. CONTRAST:  80mL OMNIPAQUE IOHEXOL 350 MG/ML SOLN COMPARISON:  Chest x-ray dated 11/17/2015. FINDINGS: Focal nonocclusive pulmonary emboli are present within 2 segmental pulmonary artery branches to the lateral aspects of the right middle lobe, best seen on images 135 through 143 of series 7. No other convincing pulmonary emboli identified within the main, lobar, or central segmental pulmonary arteries bilaterally. Some of the more peripheral segmental and subsegmental pulmonary arteries are difficult to definitively characterize due to inadequate contrast opacification, but I suspect additional small focal pulmonary embolus within a peripheral subsegmental pulmonary artery branch to the posterior-medial right lower lobe. Heart size is upper normal. No pericardial effusion. No evidence of right heart strain. Scattered atherosclerotic changes are seen along the walls of the normal-caliber thoracic aorta. There are small bilateral pleural effusions with adjacent bibasilar atelectasis. There is also mild biapical scarring/fibrosis. Remainder of the lungs are clear. Trachea and central bronchi are unremarkable. There is upper abdominal ascites, seen on limited images of the upper abdomen. Degenerative changes are seen throughout the thoracic spine but no acute osseous abnormality. Old healed rib fractures noted on the left. Review of the MIP images confirms the above findings. IMPRESSION: 1. Focal acute-appearing nonocclusive pulmonary emboli within 2 segmental pulmonary artery branches to the lateral aspects of the right middle lobe. 2. Suspect additional small pulmonary embolus within a peripheral subsegmental pulmonary  artery branch to the posterior-medial right lower lobe, but not convincing. 3. Small bilateral pleural effusions with adjacent bibasilar atelectasis. 4. Upper abdominal ascites, incompletely imaged. 5. Thoracic aorta atherosclerosis.  No thoracic aortic aneurysm. Electronically Signed: By: Bary RichardStan  Maynard M.D. On: 11/18/2015 13:14   Nm Myocar Multi W/spect W/wall Motion / Ef  11/11/2015  CLINICAL DATA:  81 year old female with wide-complex tachycardia. EXAM: MYOCARDIAL IMAGING WITH SPECT (REST AND PHARMACOLOGIC-STRESS) GATED LEFT VENTRICULAR WALL MOTION STUDY LEFT VENTRICULAR EJECTION FRACTION TECHNIQUE: Standard myocardial SPECT imaging was performed after resting intravenous injection of 10 mCi [id number] sestamibi. Subsequently, intravenous infusion of Lexiscan was performed under the supervision of the Cardiology staff. At peak effect of the drug, 30 mCi Tc-4427m sestamibi was injected intravenously and standard myocardial SPECT imaging was performed. Quantitative gated imaging was also performed to evaluate left ventricular wall motion, and estimate left ventricular ejection fraction. COMPARISON:  Chest radiograph 11/09/2015 FINDINGS: Perfusion: There is no evidence to suggest reversible ischemia. However, there are decreased counts along the inferior wall on both the rest and stress images and this is probably related to increased uptake in the upper abdomen. As a result, limited evaluation of the inferior wall. Wall Motion: Normal left ventricular wall motion. No left ventricular dilation. Left Ventricular Ejection Fraction: 70 % End diastolic volume 51 ml End systolic volume 15 ml IMPRESSION: 1. No evidence for reversible ischemia  but limited evaluation of the inferior wall as described. 2. Normal left ventricular wall motion. 3. Left ventricular ejection fraction is 70%. 4. Low-risk stress test findings*. *2012 Appropriate Use Criteria for Coronary Revascularization Focused Update: J Am Coll Cardiol.  2012;59(9):857-881. http://content.dementiazones.com.aspx?articleid=[email or web address] Electronically Signed   By: Richarda Overlie M.D.   On: 11/11/2015 13:50   Dg Chest Port 1 View  11/21/2015  CLINICAL DATA:  Shortness of Breath EXAM: PORTABLE CHEST 1 VIEW COMPARISON:  Chest CT November 18, 2015 and chest radiograph November 17, 2015 FINDINGS: There is airspace consolidation in the left mid lower lung zones. There is patchy airspace consolidation in the right base. There is a small left effusion. There is cardiomegaly mild pulmonary venous hypertension. There is atherosclerotic calcification aorta. No bone lesions. There is thoracolumbar levoscoliosis. IMPRESSION: Airspace consolidation bilaterally. Question progression of congestive heart failure versus superimposed pneumonia. Both entities may exist concurrently. Electronically Signed   By: Bretta Bang III M.D.   On: 11/21/2015 08:39   Dg Chest Port 1 View  11/17/2015  CLINICAL DATA:  81 year old female with shortness of breath EXAM: PORTABLE CHEST 1 VIEW COMPARISON:  Chest radiograph dated 11/09/2015 FINDINGS: Single-view of the chest demonstrate emphysematous changes of the lungs. There is an area of increased density at the left mid and lower lung field which appears slightly improved compared to the prior study. Small left pleural effusion may be present. The cardiac silhouette is within normal limits. The osseous structures are grossly unremarkable. IMPRESSION: Emphysema with partial interval improvement of the previously seen left mid and lower lung airspace density. Electronically Signed   By: Elgie Collard M.D.   On: 11/17/2015 22:54   Dg Chest Port 1 View  11/09/2015  CLINICAL DATA:  Shortness of breath and nausea tonight. EXAM: PORTABLE CHEST 1 VIEW COMPARISON:  10/31/2015 FINDINGS: Mild cardiac enlargement without significant vascular congestion. Emphysematous changes with chronic interstitial fibrosis in the lungs. Increased density in the left  lung base may be due to shallow inspiration but infiltration or atelectasis in the left base is not excluded. No pneumothorax. Calcified and tortuous aorta. IMPRESSION: Cardiac enlargement. Emphysematous changes with interstitial fibrosis in the lungs. Possible superimposed infiltration or atelectasis in the left base. Electronically Signed   By: Burman Nieves M.D.   On: 11/09/2015 00:29   Dg Abd 2 Views  11/20/2015  CLINICAL DATA:  Abdominal distension EXAM: ABDOMEN - 2 VIEW COMPARISON:  None. FINDINGS: No disproportionately dilated small bowel loops or appreciable air-fluid levels. Minimal colonic stool volume. No evidence of pneumatosis or pneumoperitoneum. Pleural effusions are seen in both lung bases. Atherosclerotic abdominal aorta. Tiny round calcification overlies the right bladder, nonspecific, likely a calcified venous phlebolith, cannot exclude a distal right ureteral stone. IMPRESSION: 1. Nonobstructive bowel gas pattern. 2. Small bilateral pleural effusions. Electronically Signed   By: Delbert Phenix M.D.   On: 11/20/2015 13:29     PERTINENT LAB RESULTS: CBC:  Recent Labs  11/22/15 0413 11/23/15 0511  WBC 28.6* 30.1*  HGB 12.4 12.8  HCT 39.1 38.5  PLT 306 281   CMET CMP     Component Value Date/Time   NA 131* 11/23/2015 0511   K 4.5 11/23/2015 0511   CL 101 11/23/2015 0511   CO2 20* 11/23/2015 0511   GLUCOSE 189* 11/23/2015 0511   BUN 67* 11/23/2015 0511   CREATININE 2.02* 11/23/2015 0511   CALCIUM 9.8 11/23/2015 0511   PROT 4.3* 11/18/2015 0537   ALBUMIN 1.8* 11/18/2015 0537   AST  20 11/18/2015 0537   ALT 9* 11/18/2015 0537   ALKPHOS 69 11/18/2015 0537   BILITOT 0.7 11/18/2015 0537   GFRNONAA 21* 11/23/2015 0511   GFRAA 24* 11/23/2015 0511    GFR Estimated Creatinine Clearance: 14.8 mL/min (by C-G formula based on Cr of 2.02). No results for input(s): LIPASE, AMYLASE in the last 72 hours. No results for input(s): CKTOTAL, CKMB, CKMBINDEX, TROPONINI in the  last 72 hours. Invalid input(s): POCBNP No results for input(s): DDIMER in the last 72 hours. No results for input(s): HGBA1C in the last 72 hours. No results for input(s): CHOL, HDL, LDLCALC, TRIG, CHOLHDL, LDLDIRECT in the last 72 hours. No results for input(s): TSH, T4TOTAL, T3FREE, THYROIDAB in the last 72 hours.  Invalid input(s): FREET3 No results for input(s): VITAMINB12, FOLATE, FERRITIN, TIBC, IRON, RETICCTPCT in the last 72 hours. Coags: No results for input(s): INR in the last 72 hours.  Invalid input(s): PT Microbiology: Recent Results (from the past 240 hour(s))  Blood culture (routine x 2)     Status: None (Preliminary result)   Collection Time: 11/17/15 11:49 PM  Result Value Ref Range Status   Specimen Description BLOOD RIGHT HAND  Final   Special Requests BOTTLES DRAWN AEROBIC AND ANAEROBIC 5CC  Final   Culture NO GROWTH 4 DAYS  Final   Report Status PENDING  Incomplete  Blood culture (routine x 2)     Status: None (Preliminary result)   Collection Time: 11/17/15 11:53 PM  Result Value Ref Range Status   Specimen Description BLOOD RIGHT WRIST  Final   Special Requests BOTTLES DRAWN AEROBIC AND ANAEROBIC 5CC  Final   Culture NO GROWTH 4 DAYS  Final   Report Status PENDING  Incomplete  C difficile quick scan w PCR reflex     Status: Abnormal   Collection Time: 11/18/15  6:36 AM  Result Value Ref Range Status   C Diff antigen POSITIVE (A) NEGATIVE Final   C Diff toxin POSITIVE (A) NEGATIVE Final   C Diff interpretation   Final    CRITICAL RESULT CALLED TO, READ BACK BY AND VERIFIED WITH:    Comment: SPOKE TO RN W. HIX AT 805 ON 11/17/2016 PER K.PEELE     BRIEF HOSPITAL COURSE:  Brief Narrative: 81 yo female with numerous recent admission, failure to thrive synd, chronic diastolic heart failure admitted with sepsis from C Diff Colitis. Developed acute resp failure on admission, CT Angio chest showed small Pul Embolism. Hospital course complicated by renal failure  and worsening resp failulre from  Asp PNA/HCAP. Seen by palliative care-initial plans were to continue with medical treatment, DO NOT RESUSCITATE was already in place. Unfortunately patient continued to deteriorate, subsequently after discussion with patient and family, patient was transitioned to comfort measures. Plans are to discharge to residential hospice. Patient with multiorgan failure, ongoing diarrhea and respiratory failure-suspect life expectancy to be days to a few weeks.  Hospital course by problem list: Sepsis: Likely secondary to C. difficile colitis and HCAP with aspiration. Initially had sepsis from C. difficile colitis, however later during the hospital course likely developed HCAP. Patient was managed initially with oral vancomycin, due to persistent leukocytosis/diarrhea IV Flagyl was added. Subsequently once pneumonia was diagnosed-after talking with family and patient, IV Zosyn was added. Given patient's overall frailty/failure to thrive syndrome, family/patient was aware of poor overall prognosis to begin with. Unfortunately patient continues to deteriorate with persistent diarrhea, respiratory failure, renal failure-family/patient has now decided to transition to full comfort measures. Palliative  care followed patient throughout the hospital stay. Plans are to discharge to residential hospice.   Active Problems: C. difficile colitis: Already improved, still persistent. Still with significant leukocytosis-despite of IV Flagyl and oral vancomycin. Unfortunately-very frail with severe failure to thrive syndrome-suspect that further escalation in terms of vancomycin enemas/dificid will not change overall outcome. Unfortunately developed respiratory failure from HCAP, and also developed renal failure during this hospital stay. After discussion with family, all antibiotics were subsequently discontinued, patient was started on comfort medications, family agreeable to transition to full  comfort measures. Agreeable to be discharged to residential hospice.    Acute hypoxic respiratory failure: Secondary to pulmonary embolism/HCAP. Managed with Xarelto and intravenous Zosyn. Follow.As noted above, repeated discussions were held with family members-plan was to cautiously continue with antibiotics to see if any improvement, if no improvement was noted-plans were to transition to comfort measures-this was done on 12/7.   HCAP: Hospital course complicated by development of respiratory failure-felt to be secondary to HCAP with likely some aspiration component from fraility/muscle weakness. Seen by speech therapy, started on a dysphagia 3 diet. Since being discharged residential hospice-goals being comfort-Will liberalize diet to regular diet.  ARF: Likely ATN in a setting of sepsis/diarrhea. Could have some amount of contrast-induced nephropathy as well-as patient did receive contrast for CT angiogram on admission. ACE inhibitor was discontinued. Patient was managed with supportive cares and gentle hydration. Unfortunately creatinine continued to increase. Patient now transition to full comfort measures..  Pulmonary embolism with left lower extremity DVT: CT angiogram chest done after patient complained of shortness of breathon admission, positive for small pulmonary embolism-started on anticoagulation with Xarelto. Lower extremity Doppler positive for left-sided DVT. All anticoagulation was discontinued on 12/7-as patient was transition to full comfort measures  Mechanical fall: Occurred 12/3 AM-patient slid out of her bed and landed on her left lower extremity. No head trauma. No swelling of her left lower extremity, able to bear weight on both legs. She was monitored and followed closely.  Elevated troponin: Mildly elevated troponin-trend is flat and not consistent with ACS-suspect demand ischemia from sepsis. Recent echocardiogram on 11/09/15 EF preserved without any wall motion  abnormality, recent nuclear stress test on 11/25 negative for ischemia. EKG negative for acute abnormalities. Denied chest pain.  Hyponatremia: Has chronic hyponatremia baseline-suspect related to volume loss from diarrhea. Until used to have persistent hyponatremia. No further blood work-as transition to comfort measures.  Hypokalemia: Secondary to diarrhea, repleted  Chronic diastolic heart failure: Suspect SOB was more for HCAP. Transition to comfort measures  Hypertension: Controlled-since blood pressure soft-discontinued amlodipine/ lisinopril  Deconditioning/failure to thrive syndrome: Secondary to advanced age with numerous recent illnesses/hospitalization-PT/Nutrition eval-SNF recommended on discharge-however due to continued deterioration-felt best to benefit from discharge to residential hospice  Palliative care/goals of care: DNR in place. Admitted with Severe sepsis from C. difficile colitis, PE/DVT hospital course complicated by development of hypoxia from pneumonia. Given advanced age/frailty/recurrent admissions-severe deconditioning-poor overall prognosis-both patient and family aware. Initially plans were to continue with DO NOT RESUSCITATE status, and IV antibiotics and hope that patient wouldn't improve. Despite multiple IV/oral antibiotics, patient continued to deteriorate, hospital course was marked by development of renal failure, respiratory failure, HCAP and ongoing C. difficile colitis. Subsequently transition to full comfort measures on 12/7. Palliative care consulted during this hospital stay  TODAY-DAY OF DISCHARGE:  Subjective:   Anna Gonzales today continues to have diarrhea, looks very frail and weak. She continues to be short of breath.  Objective:  Blood pressure 121/58, pulse 97, temperature 97.3 F (36.3 C), temperature source Oral, resp. rate 18, height 5\' 1"  (1.549 m), weight 51.71 kg (114 lb), SpO2 100 %.  Intake/Output Summary (Last 24 hours) at  11/23/15 1158 Last data filed at 11/23/15 0531  Gross per 24 hour  Intake    620 ml  Output      0 ml  Net    620 ml   Filed Weights   11/21/15 0509 11/22/15 0511 11/23/15 0442  Weight: 49.714 kg (109 lb 9.6 oz) 50.621 kg (111 lb 9.6 oz) 51.71 kg (114 lb)    Exam Awake-very lethargic-mildly tachypneic. But not in obvious distress Chest: Bilaterally clear anteriorly CVS: Heart sounds regular Abdomen: Mildly distended, bowel sounds sluggish. Soft  Neuro: Generalized weakness-but nonfocal.  DISCHARGE CONDITION: Stable-but deteriorating.  DISPOSITION: Residential Hospice  DISCHARGE INSTRUCTIONS:    Activity:  As tolerated   Get Medicines reviewed and adjusted: Please take all your medications with you for your next visit with your Primary MD  Please request your Primary MD to go over all hospital tests and procedure/radiological results at the follow up, please ask your Primary MD to get all Hospital records sent to his/her office.  If you experience worsening of your admission symptoms, develop shortness of breath, life threatening emergency, suicidal or homicidal thoughts you must seek medical attention immediately by calling 911 or calling your MD immediately  if symptoms less severe.  You must read complete instructions/literature along with all the possible adverse reactions/side effects for all the Medicines you take and that have been prescribed to you. Take any new Medicines after you have completely understood and accpet all the possible adverse reactions/side effects.   Do not drive when taking Pain medications.   Do not take more than prescribed Pain, Sleep and Anxiety Medications  Special Instructions: If you have smoked or chewed Tobacco  in the last 2 yrs please stop smoking, stop any regular Alcohol  and or any Recreational drug use.  Wear Seat belts while driving.  Please note  You were cared for by a hospitalist during your hospital stay. Once you are  discharged, your primary care physician will handle any further medical issues. Please note that NO REFILLS for any discharge medications will be authorized once you are discharged, as it is imperative that you return to your primary care physician (or establish a relationship with a primary care physician if you do not have one) for your aftercare needs so that they can reassess your need for medications and monitor your lab values.   Diet recommendation: Regular Diet  Discharge Instructions    Diet general    Complete by:  As directed      Increase activity slowly    Complete by:  As directed            Follow-up Information    Follow up with Ginette Otto, MD.   Specialty:  Internal Medicine   Why:  As needed   Contact information:   [address] [place] [address] [phone]      Total Time spent on discharge equals  45 minutes.  SignedJeoffrey Massed 11/23/2015 11:58 AM

## 2015-11-23 NOTE — Progress Notes (Signed)
[facility] [employer]   Received request from [place] for family interest in [facility]. Chart reviewed.  [employer] available today. Met with niece, Luellen Pucker to complete paperwork.  ? Please fax discharge summary to [phone]. ? RN please call report to [phone]. ? Please arrange transport for as soon as possible.  ? Thank you, Freddi Starr RN, [facility] Liaison [phone]

## 2015-11-23 NOTE — Progress Notes (Signed)
PT Cancellation Note  Patient Details Name: Anna Gonzales MRN: [id number] DOB: 24-Apr-1926   Cancelled Treatment:    Reason Eval/Treat Not Completed: Other (comment) (Switching to a comfort care approach, PT will sign off.).  Please place new PT order if becomes appropriate.  Michail JewelsAshley Parr PT, DPT [phone] Pager: [phone] 11/23/2015, 9:40 AM

## 2015-11-23 NOTE — Clinical Social Work Note (Signed)
Clinical Social Worker facilitated patient discharge including contacting patient family and facility to confirm patient discharge plans.  Clinical information faxed to facility and family agreeable with plan.  CSW [facility]  RN to call report prior to discharge.  Clinical Social Worker will sign off for now as social work intervention is no longer needed. Please consult us again if new need arises.  Macario GoldsJesse Gaia Gull[place], [place] [phone]

## 2015-12-18 DEATH — deceased
